# Patient Record
Sex: Female | Born: 1970 | Race: White | Hispanic: Yes | Marital: Single | Smoking: Never smoker
Health system: Southern US, Community
[De-identification: ages and names within clinical notes are randomized; demographics above are authoritative.]

## PROBLEM LIST (undated history)

## (undated) DIAGNOSIS — I10 Essential (primary) hypertension: Secondary | ICD-10-CM

## (undated) DIAGNOSIS — E119 Type 2 diabetes mellitus without complications: Secondary | ICD-10-CM

---

## 2002-04-05 ENCOUNTER — Ambulatory Visit (HOSPITAL_COMMUNITY): Admission: RE | Admit: 2002-04-05 | Discharge: 2002-04-05 | Payer: Self-pay | Admitting: *Deleted

## 2002-04-10 ENCOUNTER — Encounter: Admission: RE | Admit: 2002-04-10 | Discharge: 2002-04-10 | Payer: Self-pay | Admitting: *Deleted

## 2002-04-17 ENCOUNTER — Encounter: Admission: RE | Admit: 2002-04-17 | Discharge: 2002-04-17 | Payer: Self-pay | Admitting: *Deleted

## 2002-04-22 ENCOUNTER — Encounter: Admission: RE | Admit: 2002-04-22 | Discharge: 2002-07-02 | Payer: Self-pay | Admitting: *Deleted

## 2002-04-29 ENCOUNTER — Ambulatory Visit (HOSPITAL_COMMUNITY): Admission: RE | Admit: 2002-04-29 | Discharge: 2002-04-29 | Payer: Self-pay | Admitting: *Deleted

## 2002-05-01 ENCOUNTER — Encounter: Admission: RE | Admit: 2002-05-01 | Discharge: 2002-05-01 | Payer: Self-pay | Admitting: *Deleted

## 2002-05-08 ENCOUNTER — Encounter: Admission: RE | Admit: 2002-05-08 | Discharge: 2002-05-08 | Payer: Self-pay | Admitting: *Deleted

## 2002-05-16 ENCOUNTER — Encounter: Admission: RE | Admit: 2002-05-16 | Discharge: 2002-05-16 | Payer: Self-pay | Admitting: *Deleted

## 2002-05-22 ENCOUNTER — Encounter: Admission: RE | Admit: 2002-05-22 | Discharge: 2002-05-22 | Payer: Self-pay | Admitting: *Deleted

## 2002-05-24 ENCOUNTER — Inpatient Hospital Stay (HOSPITAL_COMMUNITY): Admission: AD | Admit: 2002-05-24 | Discharge: 2002-05-29 | Payer: Self-pay | Admitting: *Deleted

## 2002-05-27 ENCOUNTER — Encounter: Payer: Self-pay | Admitting: *Deleted

## 2003-09-22 ENCOUNTER — Inpatient Hospital Stay (HOSPITAL_COMMUNITY): Admission: AD | Admit: 2003-09-22 | Discharge: 2003-09-24 | Payer: Self-pay | Admitting: Obstetrics and Gynecology

## 2006-02-15 ENCOUNTER — Ambulatory Visit (HOSPITAL_COMMUNITY): Admission: RE | Admit: 2006-02-15 | Discharge: 2006-02-15 | Payer: Self-pay | Admitting: Obstetrics and Gynecology

## 2006-03-13 ENCOUNTER — Ambulatory Visit: Payer: Self-pay | Admitting: *Deleted

## 2006-03-20 ENCOUNTER — Ambulatory Visit: Payer: Self-pay | Admitting: Obstetrics & Gynecology

## 2006-03-20 ENCOUNTER — Encounter: Payer: Self-pay | Admitting: *Deleted

## 2006-03-20 ENCOUNTER — Ambulatory Visit (HOSPITAL_COMMUNITY): Admission: RE | Admit: 2006-03-20 | Discharge: 2006-03-20 | Payer: Self-pay | Admitting: Obstetrics and Gynecology

## 2006-03-27 ENCOUNTER — Ambulatory Visit: Payer: Self-pay | Admitting: Obstetrics & Gynecology

## 2006-04-03 ENCOUNTER — Ambulatory Visit: Payer: Self-pay | Admitting: Gynecology

## 2006-04-17 ENCOUNTER — Ambulatory Visit: Payer: Self-pay | Admitting: Family Medicine

## 2006-04-24 ENCOUNTER — Ambulatory Visit: Payer: Self-pay | Admitting: Obstetrics & Gynecology

## 2006-05-01 ENCOUNTER — Ambulatory Visit: Payer: Self-pay | Admitting: Obstetrics & Gynecology

## 2006-05-08 ENCOUNTER — Ambulatory Visit: Payer: Self-pay | Admitting: Family Medicine

## 2006-05-08 ENCOUNTER — Ambulatory Visit (HOSPITAL_COMMUNITY): Admission: RE | Admit: 2006-05-08 | Discharge: 2006-05-08 | Payer: Self-pay | Admitting: Family Medicine

## 2006-05-09 ENCOUNTER — Inpatient Hospital Stay (HOSPITAL_COMMUNITY): Admission: AD | Admit: 2006-05-09 | Discharge: 2006-05-12 | Payer: Self-pay | Admitting: Family Medicine

## 2006-05-09 ENCOUNTER — Ambulatory Visit: Payer: Self-pay | Admitting: Obstetrics and Gynecology

## 2006-12-16 IMAGING — US US OB COMP +14 WK
1 of 2 series · 13 of 28 positions shown · non-contrast
Comparison: none

CLINICAL DATA: Assess growth.

[Series 1: us ob comp +14 wk · 0.35mm/px · 13 of 46 slices shown]
[im 1/46]
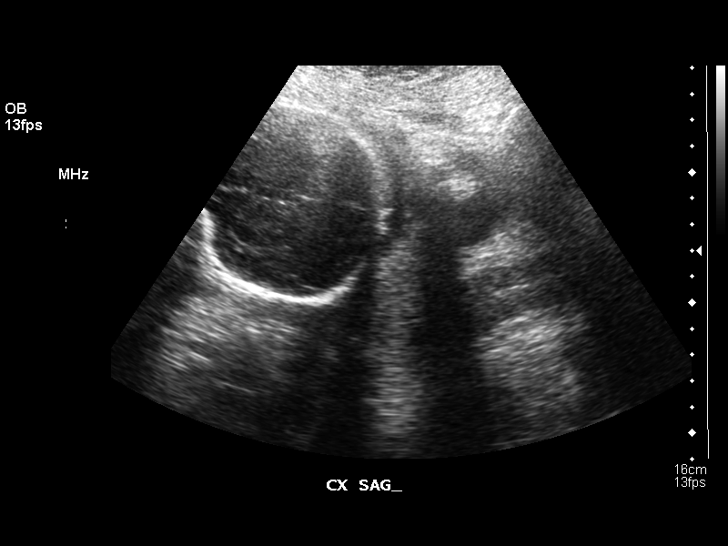
[im 4/46]
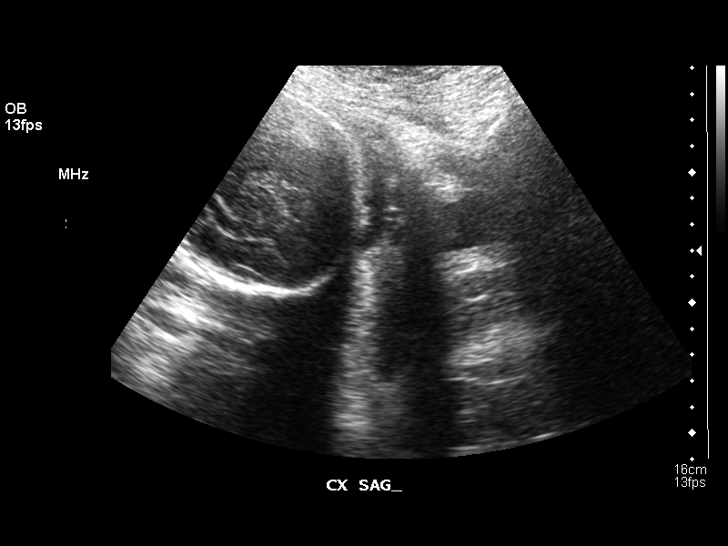
[im 7/46]
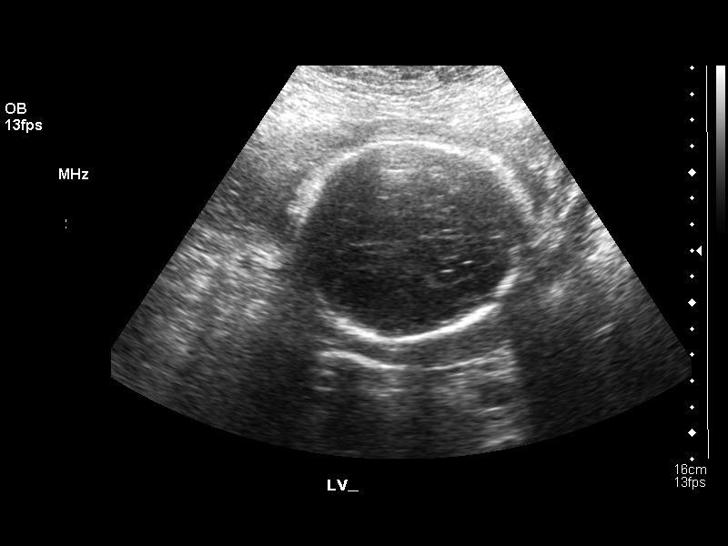
[im 11/46]
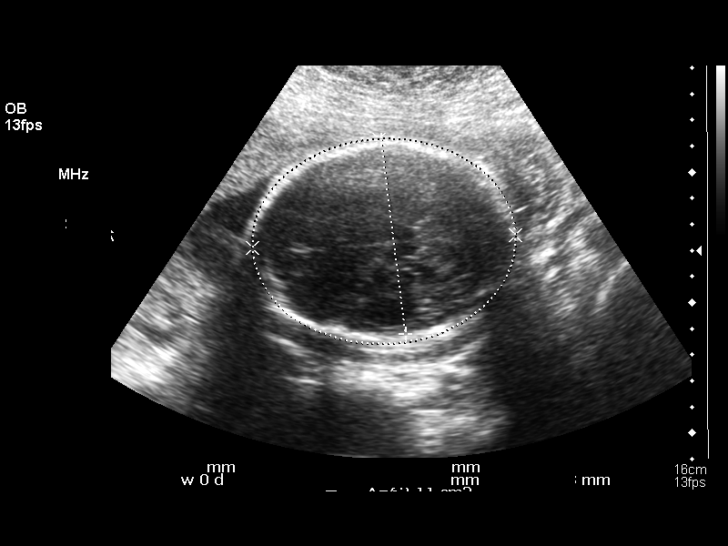
[im 14/46]
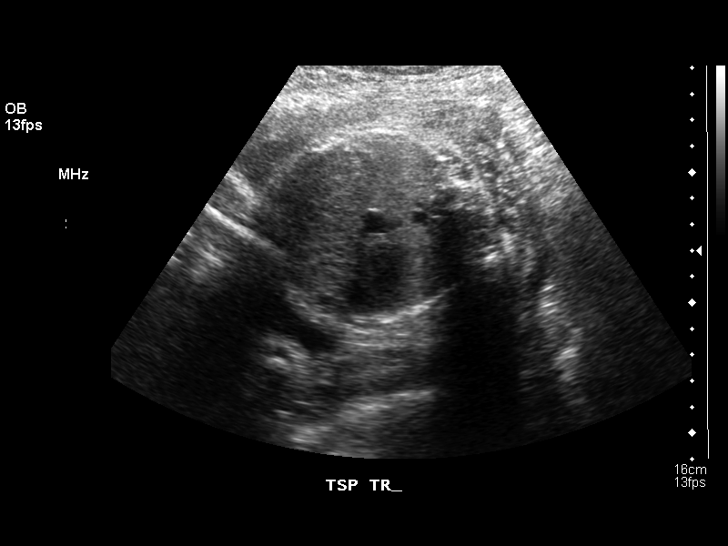
[im 18/46]
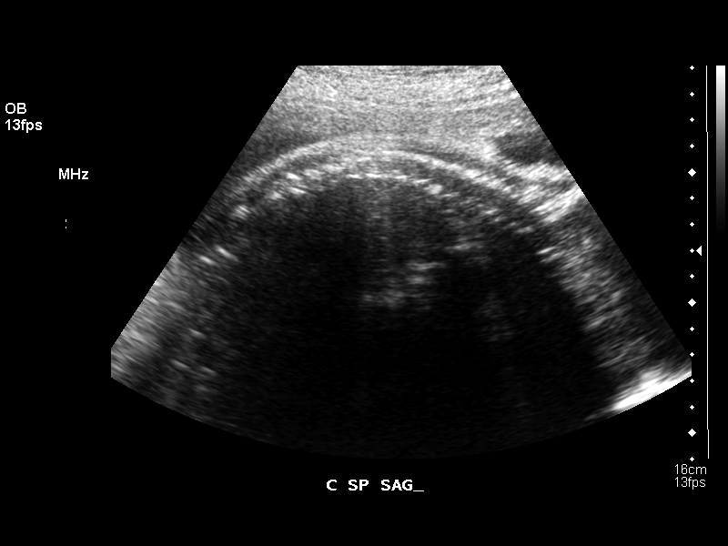
[im 23/46]
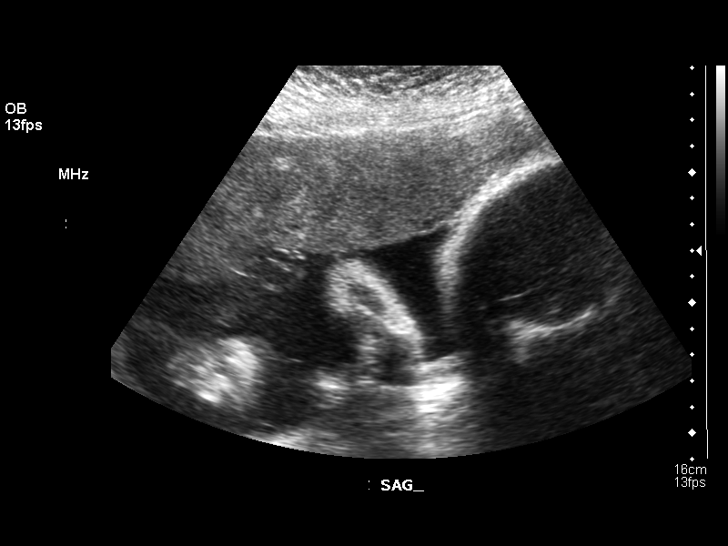
[im 26/46]
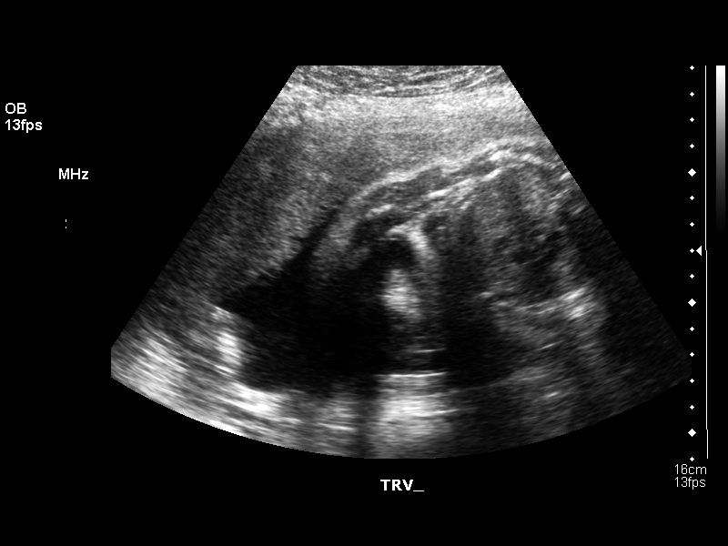
[im 30/46]
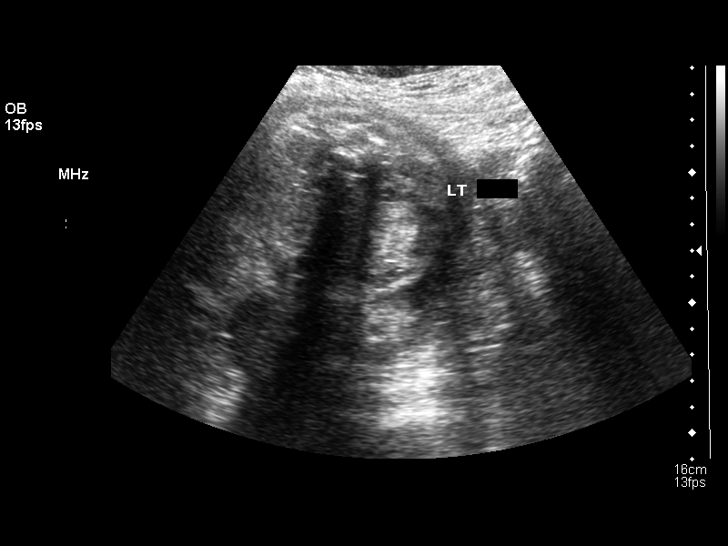
[im 33/46]
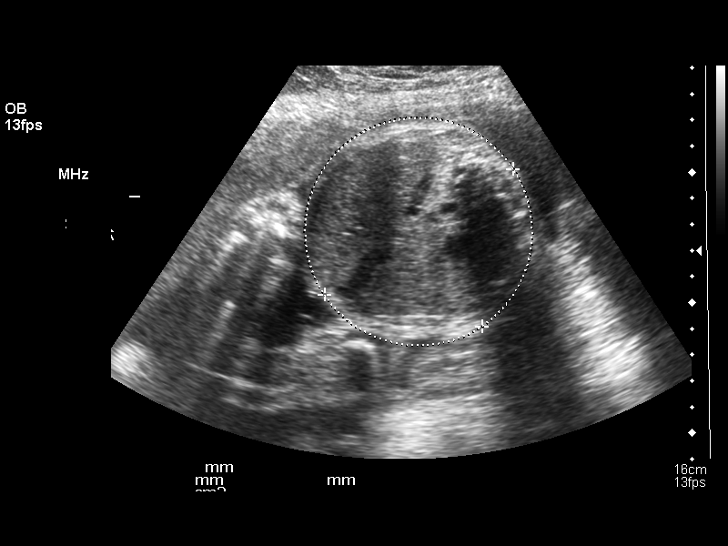
[im 37/46]
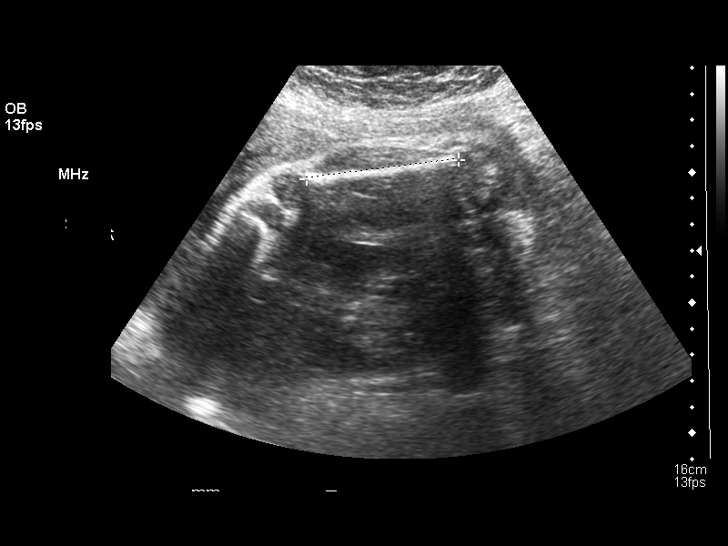
[im 40/46]
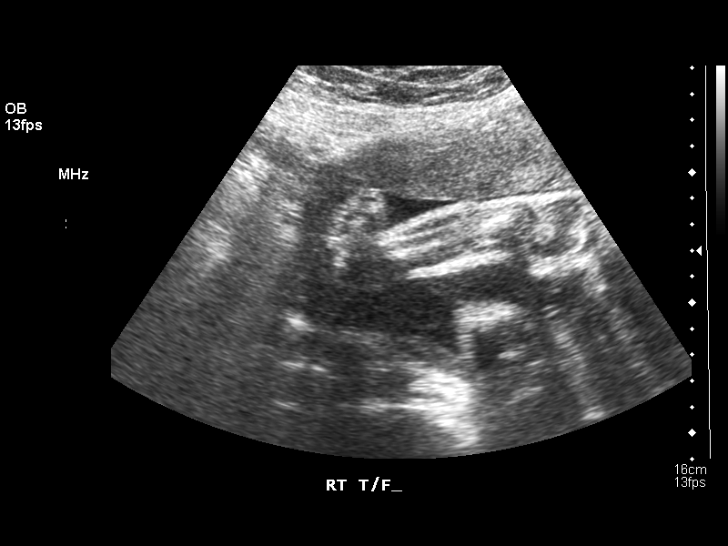
[im 44/46]
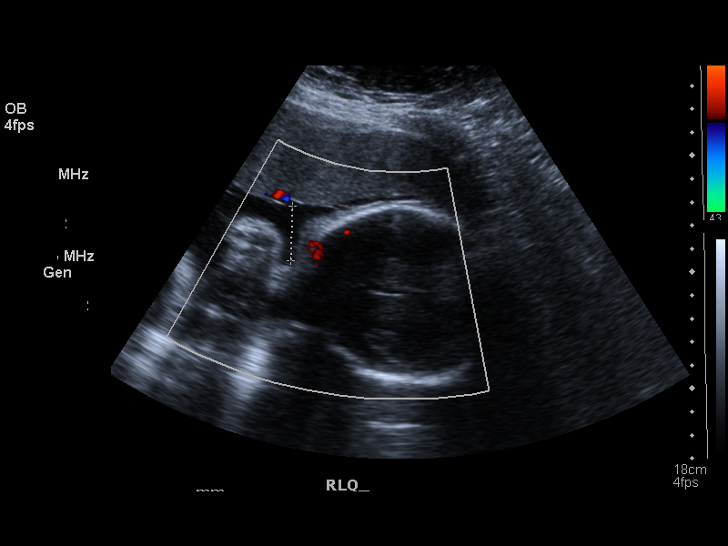

[13 of 28 positions shown; findings below may reference images not displayed]

OBSTETRICAL ULTRASOUND:
 Number of Fetuses: 1
 Heart Rate: 144
 Movement:  Yes
 Breathing:  No  
 Presentation:  Cephalic
 Placental Location: Anterior
 Grade:  I
 Previa:  No 
 Amniotic Fluid (Subjective): Normal
 Amniotic Fluid (Objective):   15.5 cm AFI (5th -95th%ile = 9.0 ? 23.4 cm for 30 wks)

 FETAL BIOMETRY
 BPD:   7.6 cm   30 w 4 d
 HC:   28.6 cm   31 w 3 d
 AC:   27.0 cm  31 w 1 d
 FL:   5.8 cm  30 w 4 d

 MEAN GA:  31 w 0 d  US EDC:  05/22/06
 Fetal indices are within normal limits. 
 EFW:  4666 g (H) 75th ? 90th%ile (5755 ? 1735 g) For 30 wks

 FETAL ANATOMY
 Lateral Ventricles:    Visualized 
 Thalami/CSP:      Visualized 
 Posterior Fossa:  Previously seen 
 Nuchal Region:    Previously seen 
 Spine:      Previously seen 
 4 Chamber Heart on Left:      Previously seen 
 Stomach on Left:      Visualized 
 3 Vessel Cord:    Visualized 
 Cord Insertion site:    Previously seen 
 Kidneys:  Visualized 
 Bladder:  Visualized 
 Extremities:      Previously seen 

 ADDITIONAL ANATOMY VISUALIZED:  Diaphragm.

 Evaluation limited by: Maternal habitus and advanced gestational age.

 MATERNAL UTERINE AND ADNEXAL FINDINGS
 Cervix: 3.5 cm transabdominally
IMPRESSION: 1.  Single intrauterine pregnancy demonstrating an estimated gestational age by ultrasound of 31 weeks and 0 days.  Correlation with expected estimated gestational age by LMP of 30 weeks and 3 days correlates with appropriate growth.  Currently the estimated fetal weight is between the 75th and 90th percentile for a 30 week gestation.  
 2.   Subjectively and quantitatively normal amniotic fluid volume and normal cervical length.
 3.  No late developing fetal anatomic abnormalities are identified associated with the lateral ventricles, stomach, kidneys, or bladder.  
 4.  The patient was sent with a preliminary copy of today?s results to a clinic visit immediately following this exam.

## 2007-02-03 IMAGING — US US OB FOLLOW-UP
1 series · 18 of 28 positions shown · non-contrast
Comparison: none

CLINICAL DATA: 37 week 3 day assigned gestational age.  Decreased fetal movement.  Maternal obesity.  Evaluate fetal growth and amniotic fluid.

[Series 1: us ob re-eval · 18 of 31 slices shown]
[im 1/31]
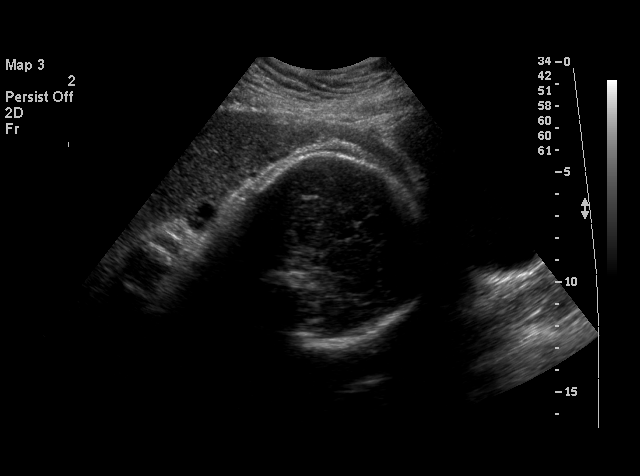
[im 3/31]
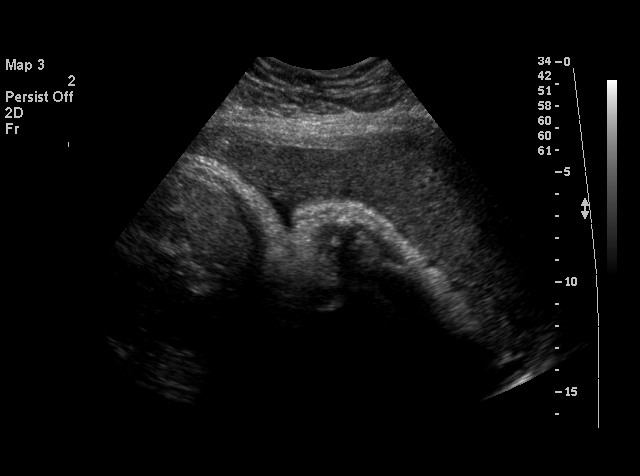
[im 4/31]
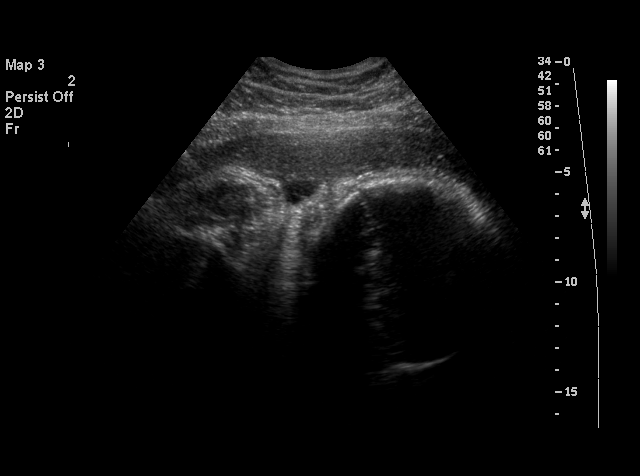
[im 6/31]
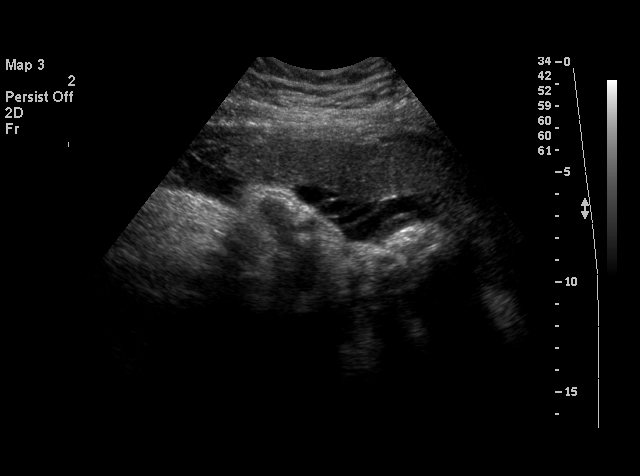
[im 8/31]
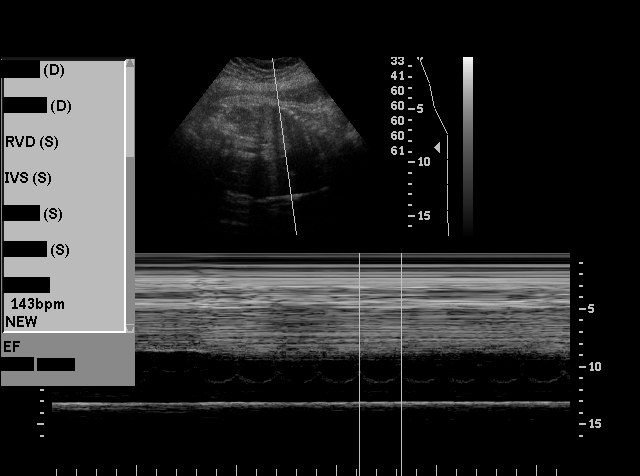
[im 9/31]
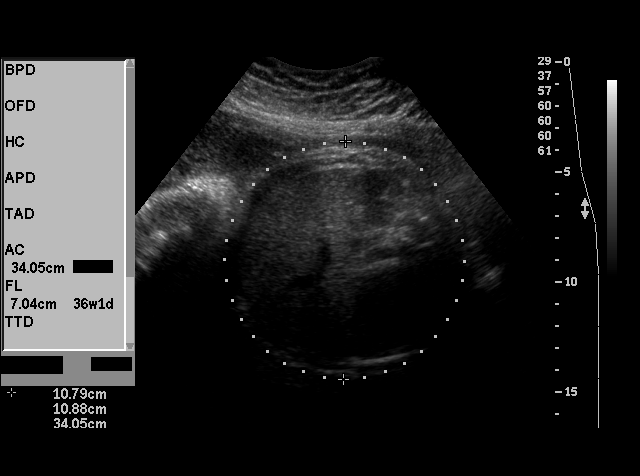
[im 12/31]
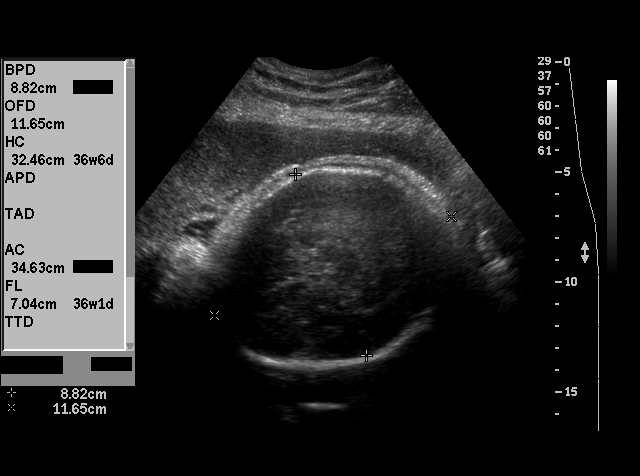
[im 13/31]
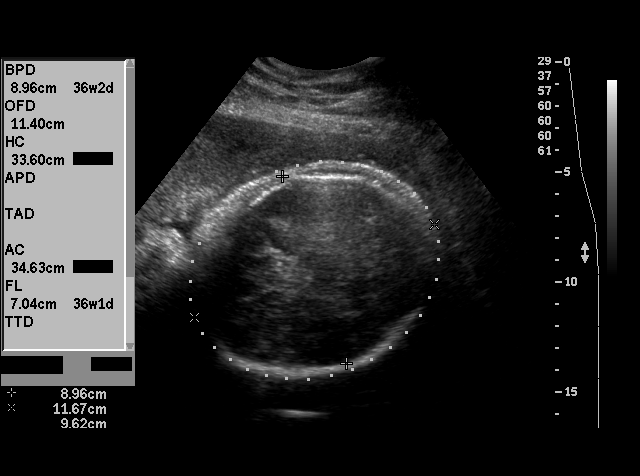
[im 15/31]
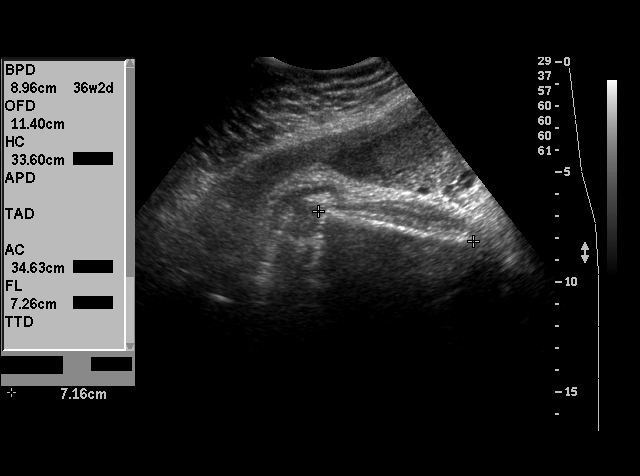
[im 16/31]
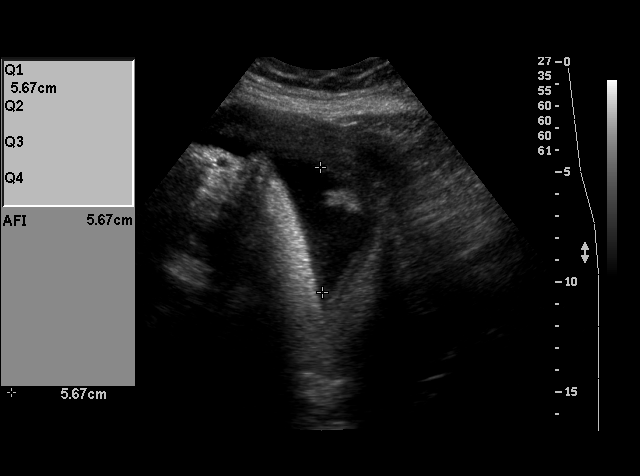
[im 18/31]
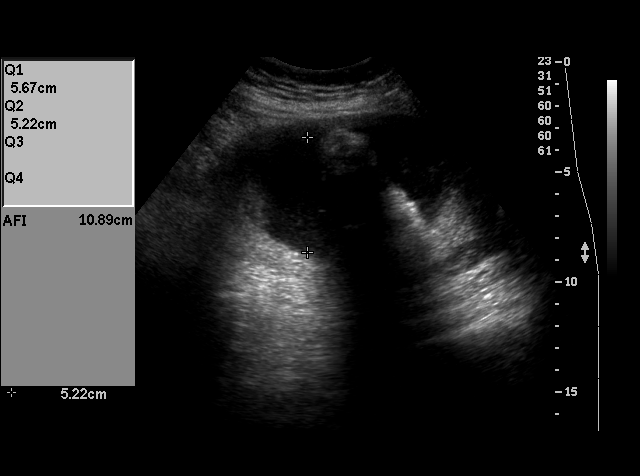
[im 19/31]
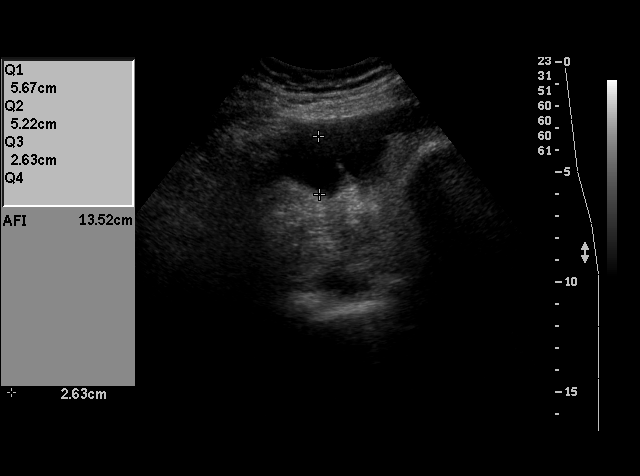
[im 22/31]
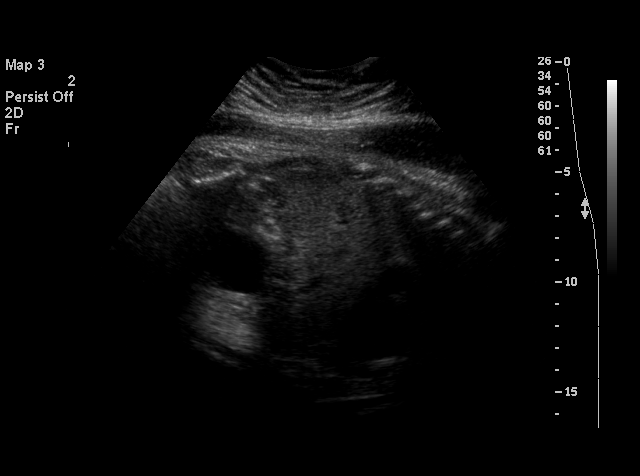
[im 24/31]
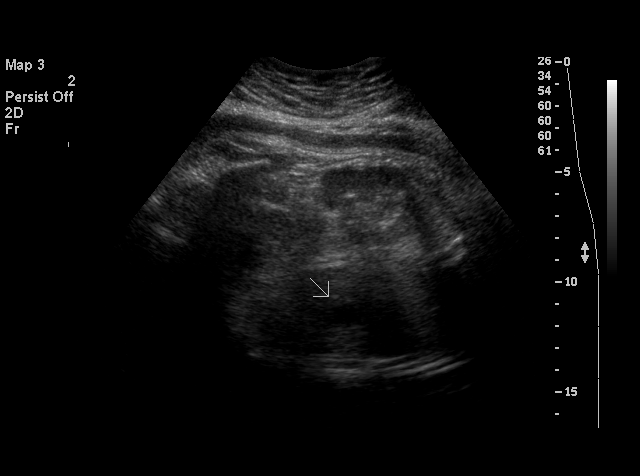
[im 25/31]
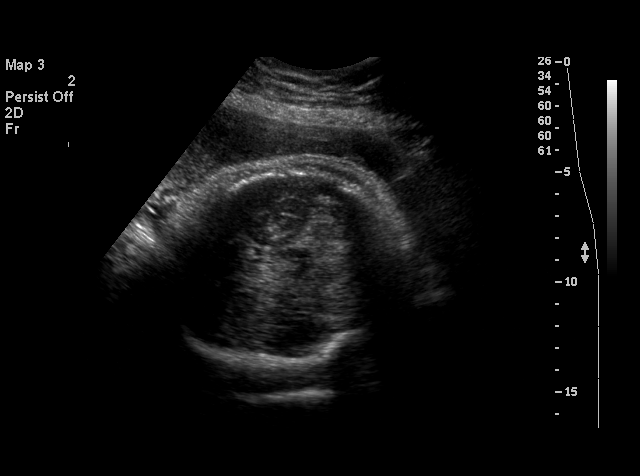
[im 27/31]
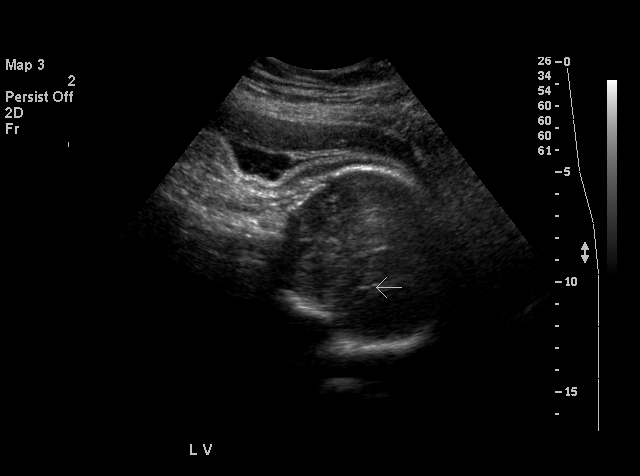
[im 28/31]
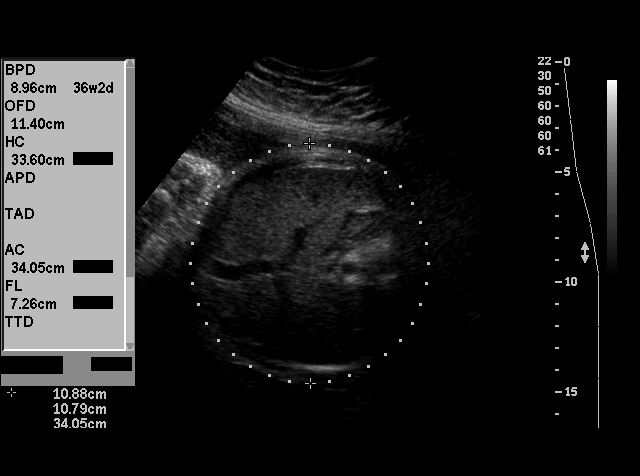
[im 31/31]
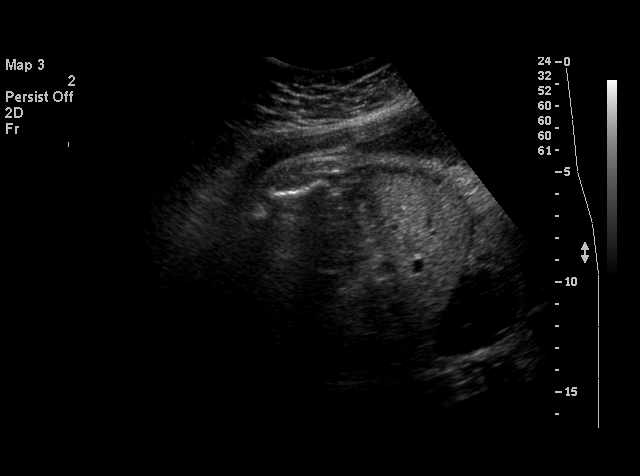

[18 of 28 positions shown; findings below may reference images not displayed]

OBSTETRICAL ULTRASOUND RE-EVALUATION:
 Number of Fetuses:  1
 Heart Rate:  143 bpm
 Movement:  Yes
 Breathing:  Yes
 Presentation:  Cephalic
 Placental Location:   Anterior
 Grade:  II
 Previa:   No
 Amniotic Fluid (subjective):  Normal
 Amniotic Fluid (objective):  AFI 13.5 cm (7th-37th %ile =  7.5 to 24.4 cm for 37 weeks) 

 FETAL BIOMETRY
 BPD:    8.9 cm    36 w 3 d
 HC:  33.1 cm  37 w 5 d
 AC:  34.1 cm  38 w 1 d
 FL:  7.2 cm  37 w 2 d

 Mean GA:  37 w 3 d    US EDC:  05/26/06
 Assigned GA:  37 w 3 d    Assigned EDC:  05/26/06

 EFW:  3086 grams  75th - 90th %ile (4722 - 9649 g) for 37 weeks 

 FETAL ANATOMY
 Lateral Ventricles:    Visualized     
 Thalami/CSP:  Visualized 
 Posterior Fossa:  Not visualized 
 Nuchal Region:  Not visualized 
 Spine:  Not visualized 
 4 Chamber Heart on Left:  Not visualized 
 Stomach on Left:  Visualized   
 3 Vessel Cord:    Not visualized 
 Cord Insertion Site:  Not visualized 
 Kidneys:  Visualized 
 Bladder:  Visualized 
 Extremities:  Not visualized 

 Evaluation limited by:  Maternal habitus and advanced gestational age.

   MATERNAL UTERINE AND ADNEXAL FINDINGS
 Cervix:  Not evaluated; >34 weeks.
IMPRESSION: 1.  Assigned gestational age is currently 37 weeks 3 days.  Appropriate fetal growth, with EFW at 75th to 90th percentile.
 2.  Normal amniotic fluid volume.

## 2007-02-04 IMAGING — US US FETAL BPP W/O NONSTRESS
1 series · 14 of 21 positions shown · non-contrast
Comparison: None.

CLINICAL DATA: Late third trimester pregnancy, possible rupture of membranes.  Gestational diabetes.  

 BIOPHYSICAL PROFILE ? 05/09/06:

[Series 1: us fetal bpp w/o nonstress · 0.41mm/px · 21 acquisitions, 14 frames shown]
[im 1/21]
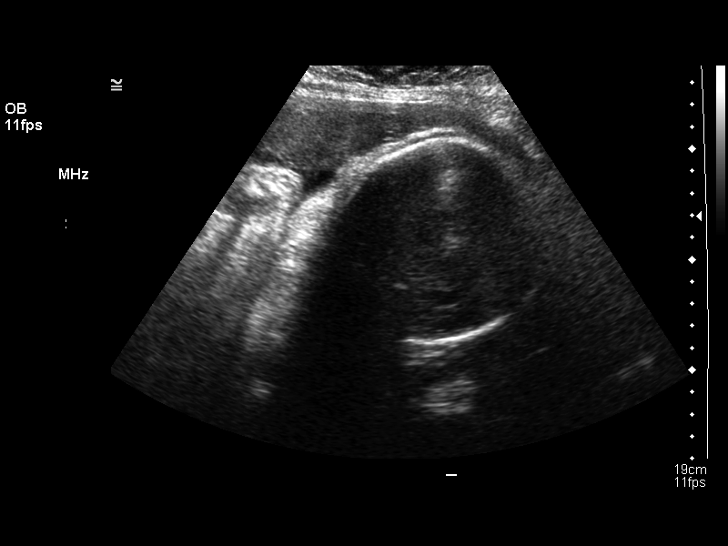
[im 3/21]
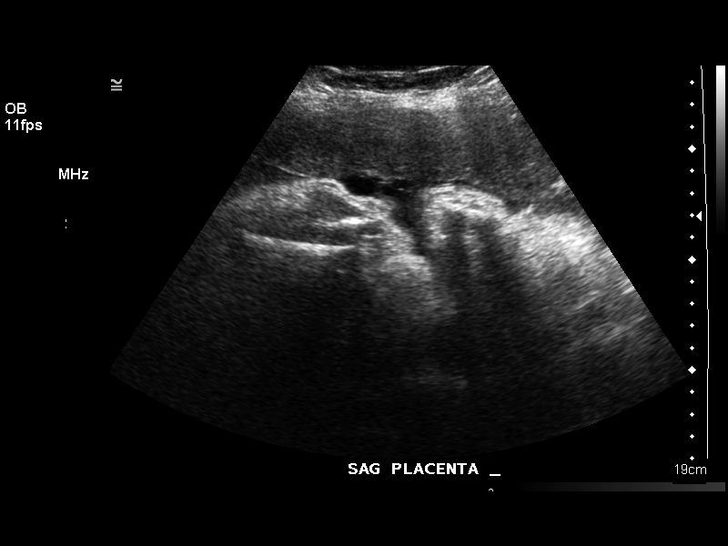
[im 4/21]
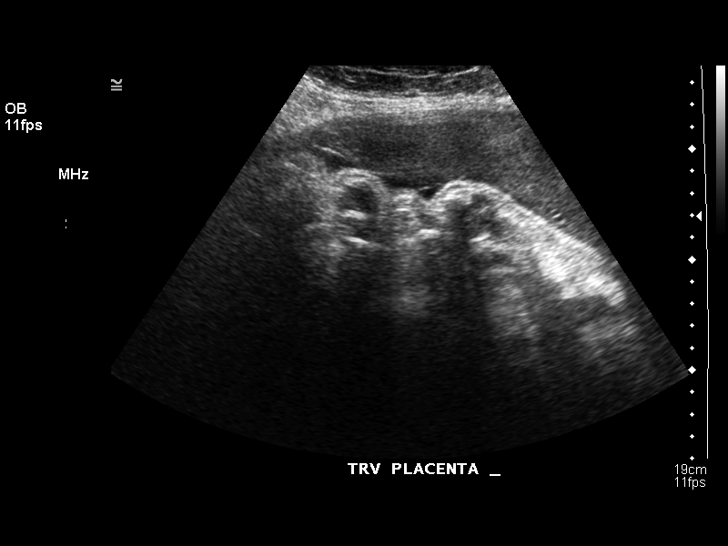
[im 6/21]
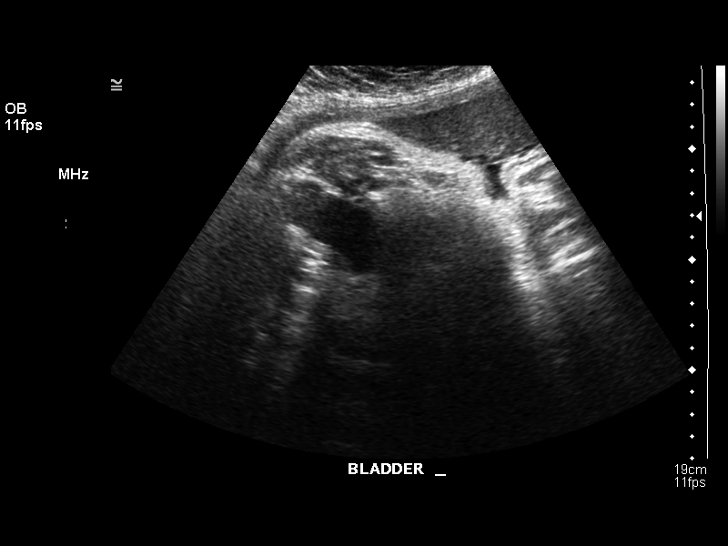
[im 7/21]
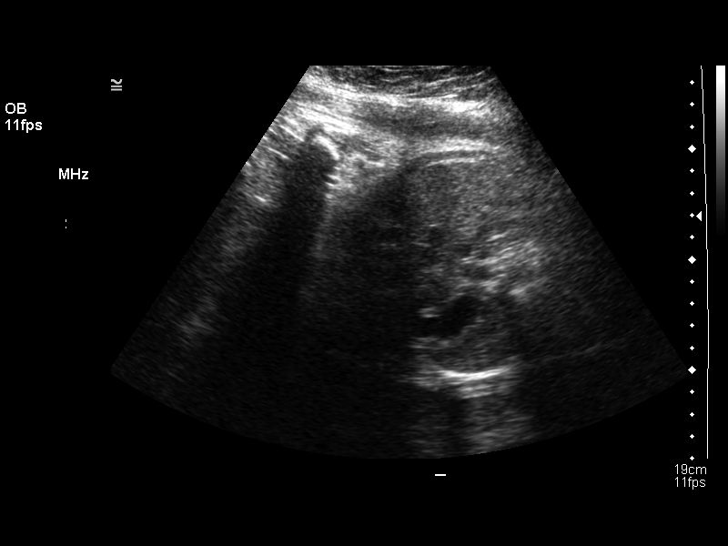
[im 9/21]
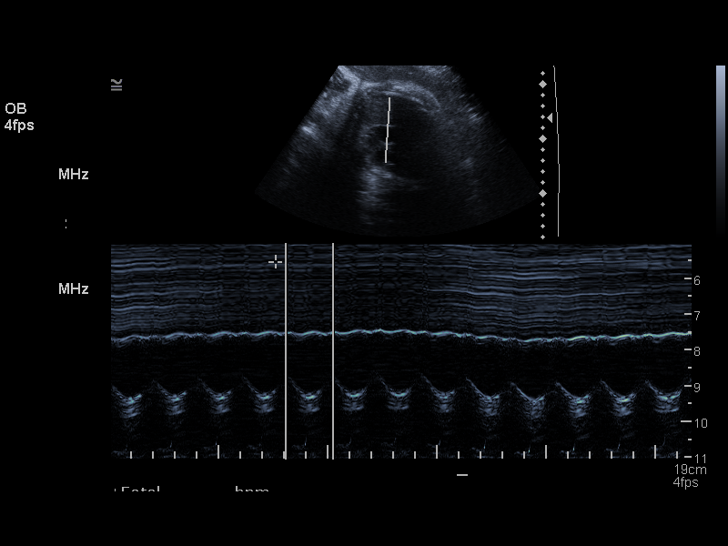
[im 10/21]
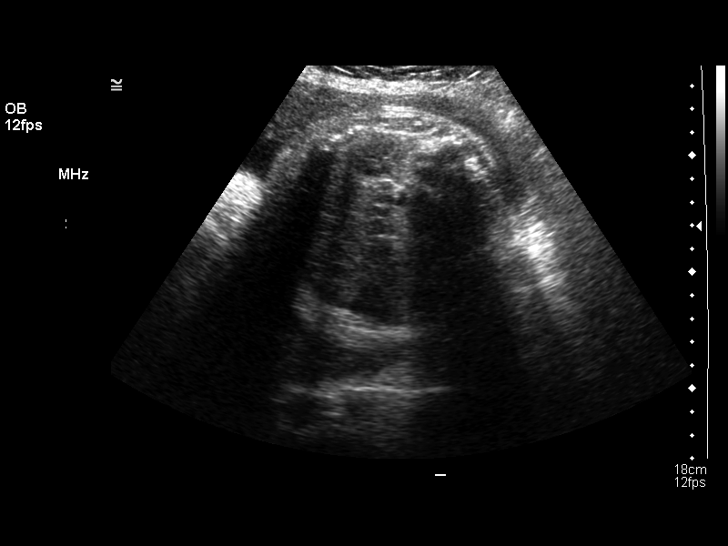
[im 12/21]
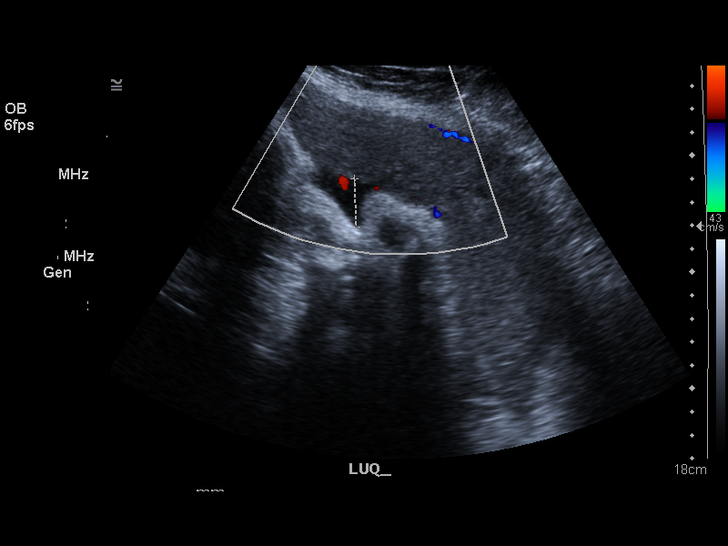
[im 13/21]
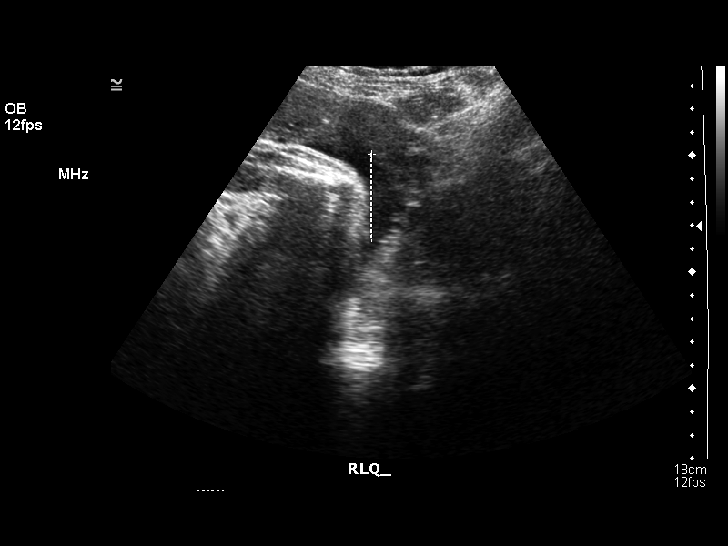
[im 15/21]
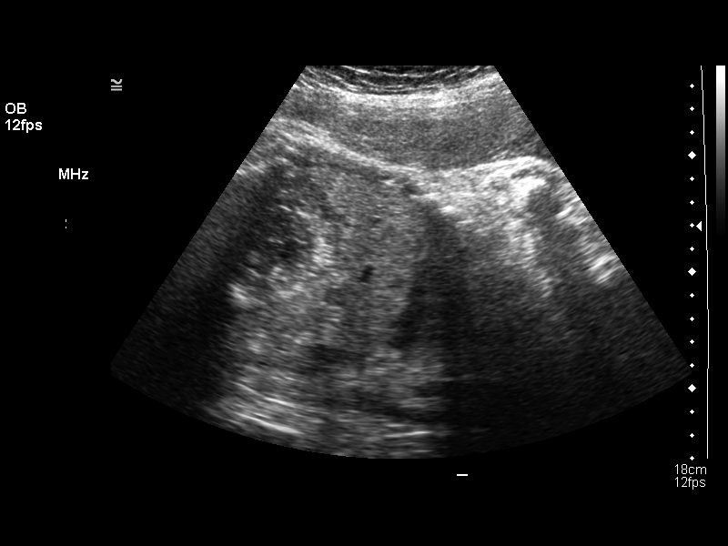
[im 16/21]
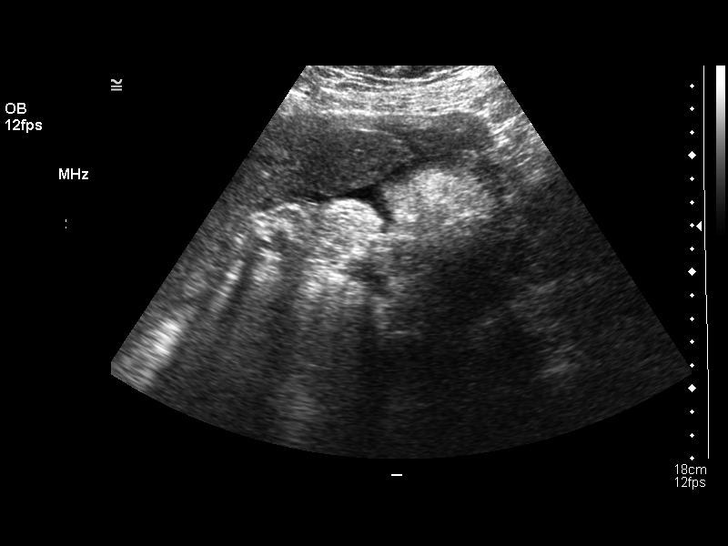
[im 18/21]
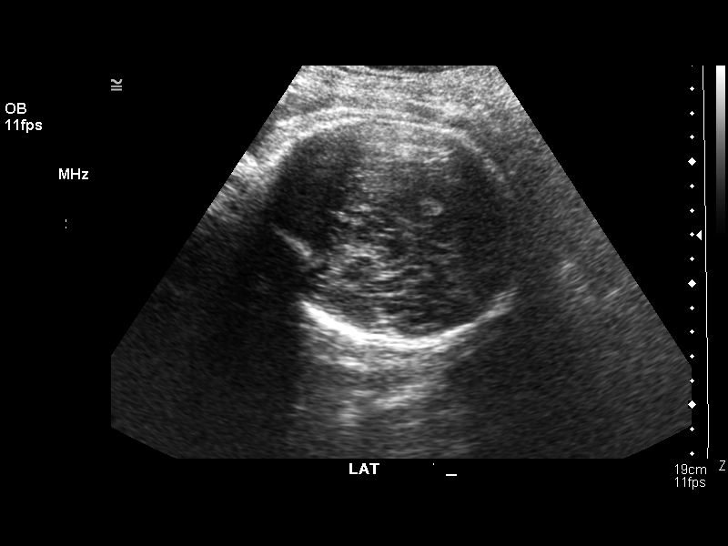
[im 19/21]
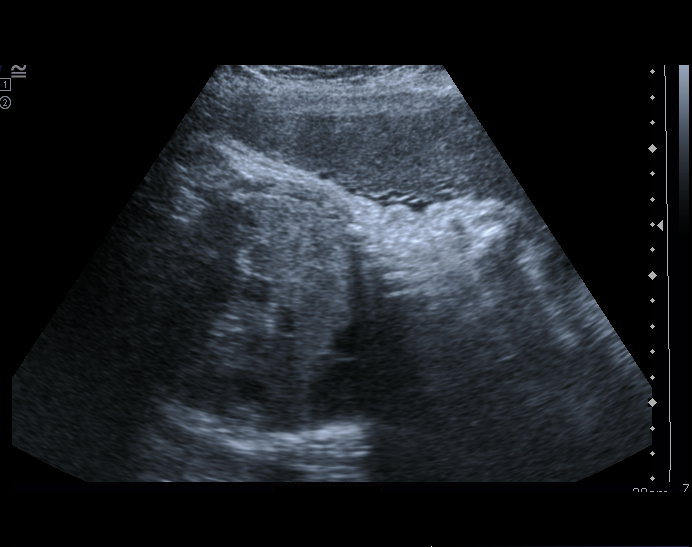
[im 21/21]
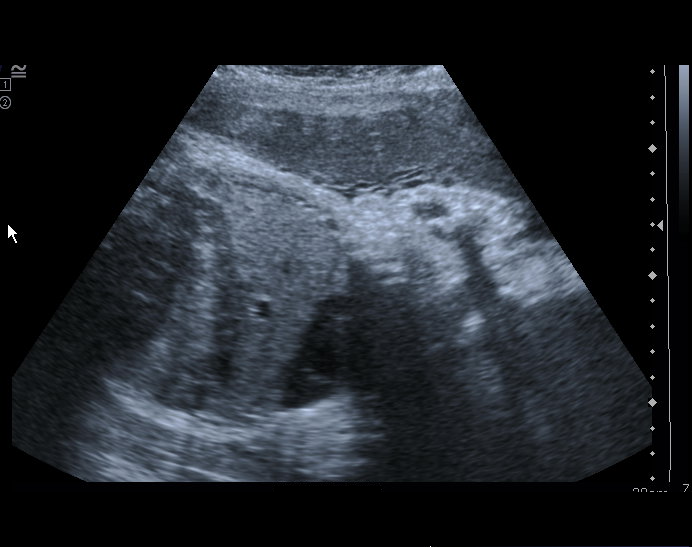

[14 of 21 positions shown; findings below may reference images not displayed]

FINDINGS: Number of Fetuses:  1
 Heart rate:  139 bpm
 Movement:  Yes (minimal movement)
 Breathing:    Yes
 Presentation:  Cephalic
 Placental Location:  Anterior
 Grade:  I
 Previa:  No
 Amniotic Fluid (Subjective):  Decreased
 Amniotic Fluid (Objective):  AFI 8.0 cm (1th-31th %ile = 7.3 to 23.9 cm for 38 weeks) 

 Fetal measurements and complete anatomic evaluation were not requested.  The following fetal anatomy was visualized on this exam:  Lateral ventricles, thalami, stomach, kidneys, bladder, and diaphragm.

 BPP SCORING
 Movements:     2  Time:  25 minutes
 Breathing:    2
 Tone:  2
 Amniotic Fluid:  2
 Total Score:  8

 MATERNAL UTERINE AND ADNEXAL FINDINGS
 Cervix:  Not evaluated; >38 weeks.
IMPRESSION: Single living intrauterine pregnancy with fetal cardiac activity at 139 beats per minute and biophysical profile score of [DATE].

## 2007-02-04 IMAGING — US US OB FOLLOW-UP
1 series · 9 of 9 positions shown · non-contrast
Comparison: none

CLINICAL DATA: 37 week 4 day assigned gestational age.  Gestational diabetes.  Probable ruptured membranes.  Evaluate fetal growth.

[Series 1: us ob follow-up · 0.37mm/px · 9 of 9 slices shown]
[im 1/9]
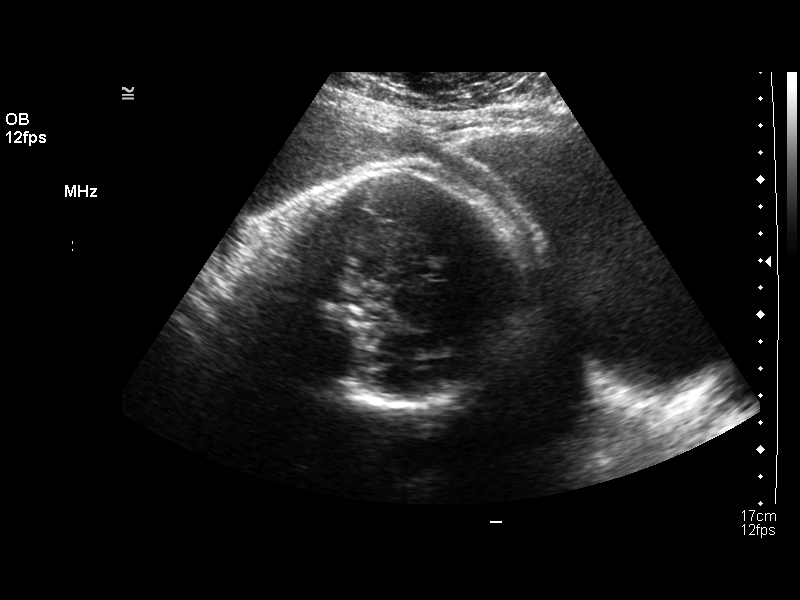
[im 2/9]
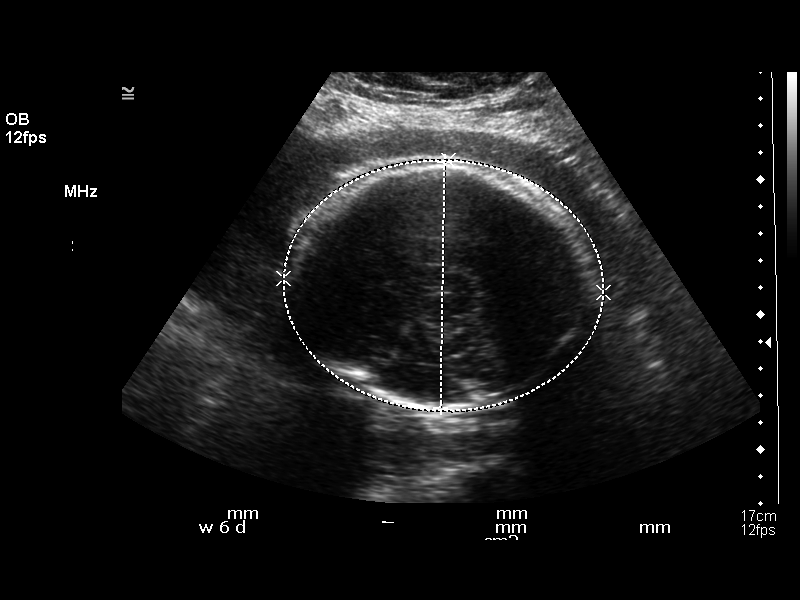
[im 3/9]
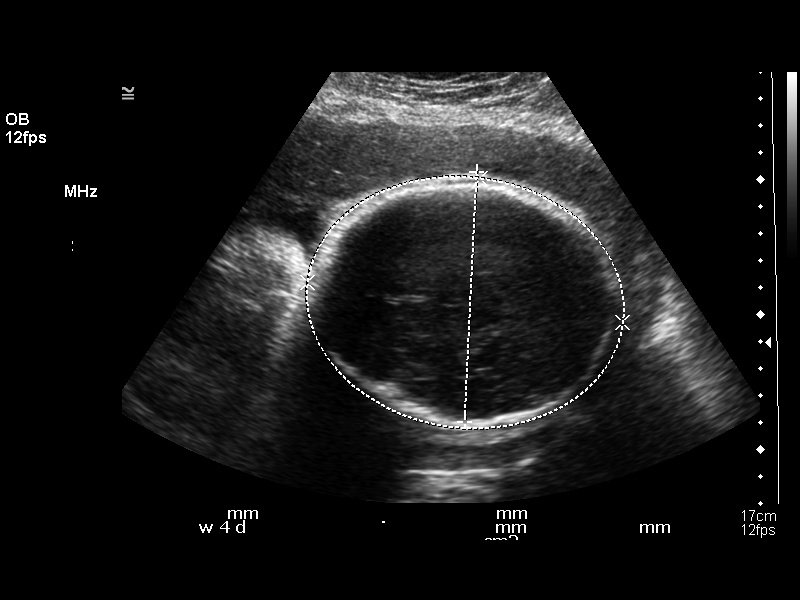
[im 4/9]
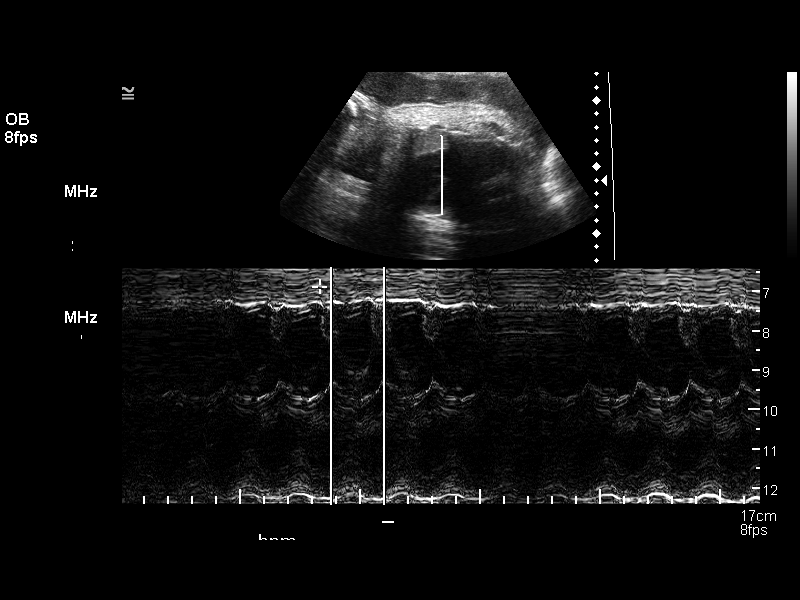
[im 5/9]
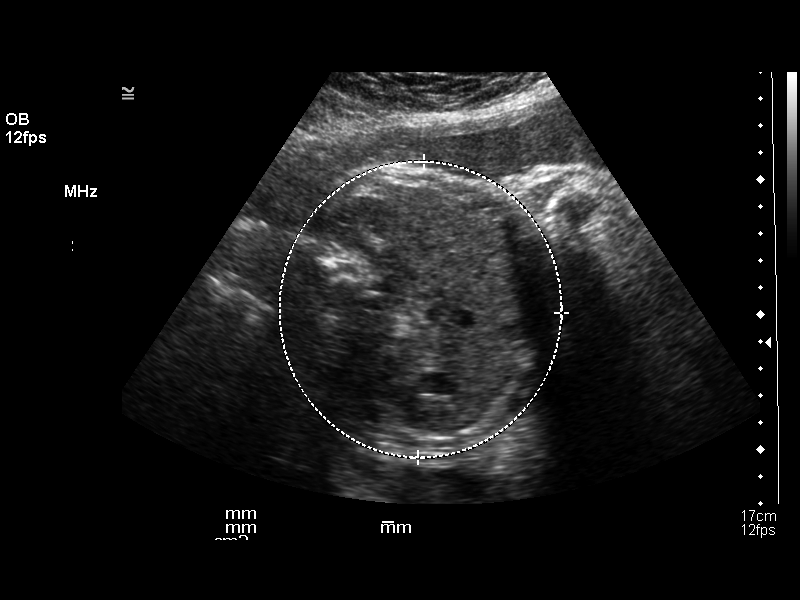
[im 6/9]
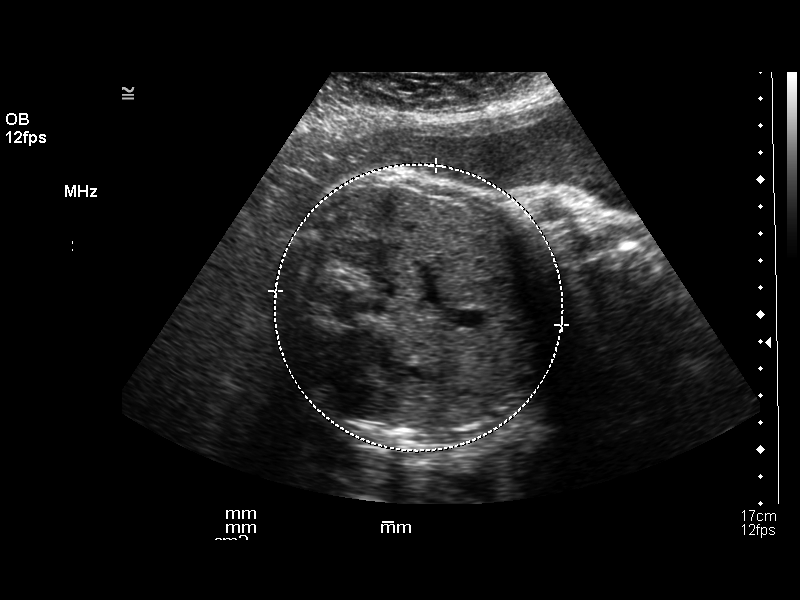
[im 7/9]
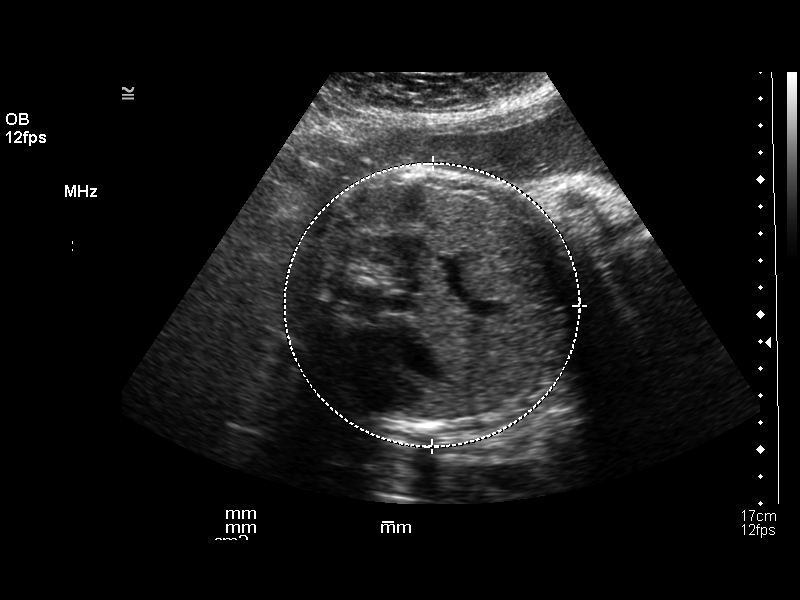
[im 8/9]
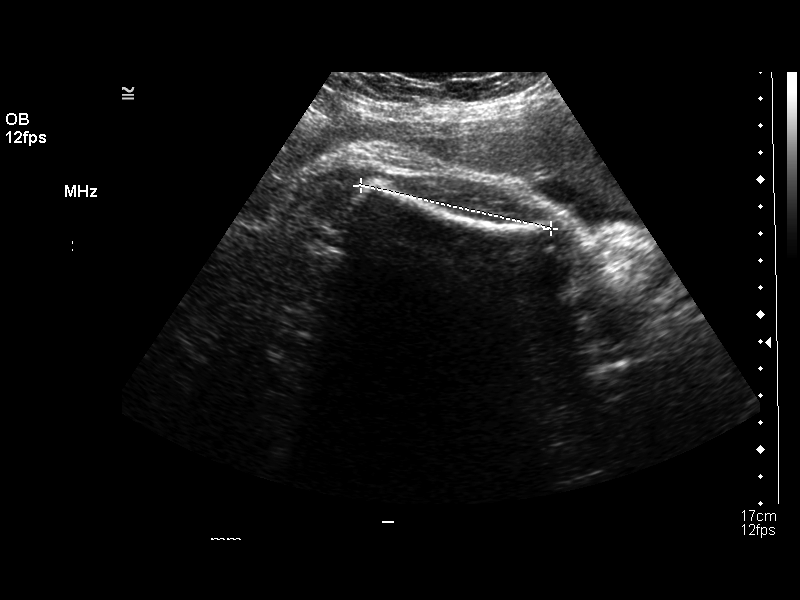
[im 9/9]
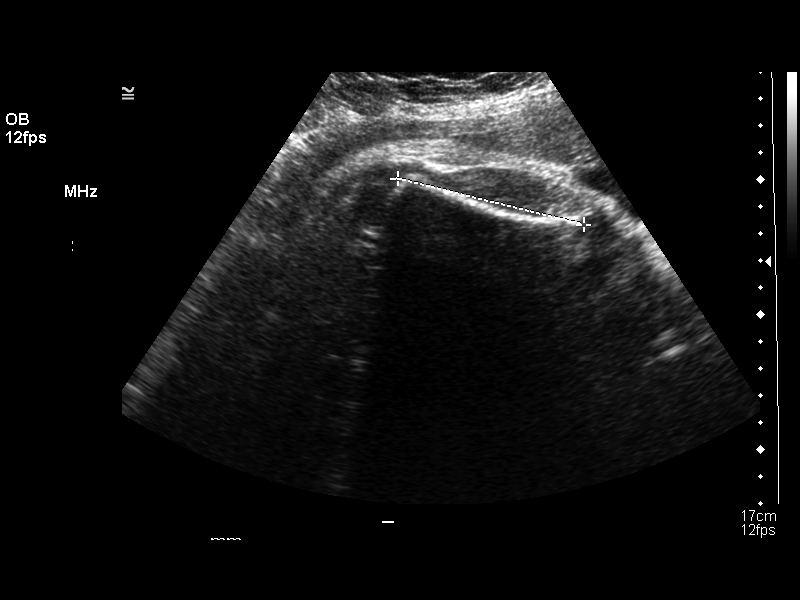

[9 of 9 positions shown; findings below may reference images not displayed]

OBSTETRICAL ULTRASOUND RE-EVALUATION:
Number of Fetuses:  1
Heart Rate: 136 bpm
Movement:  Yes
Breathing:  No
Presentation:  Cephalic
Placental Location:  Anterior
Grade:  I
Previa:  No
Amniotic Fluid (subjective):    Decreased
Amniotic Fluid (objective):  AFI 8.0 cm (7th-17th %ile = 7.3 to 23.9 cm for 38 weeks) 

FETAL BIOMETRY
BPD:    9.2    37 w 2 d
HC:  33.1 cm  37 w 5 d
AC:  33.5 cm  37 w 3 d
FL:  7.1 cm  36 w 3 d

Mean GA:  37 w 2 d    US EDC:   05/28/06
Assigned GA:  37 w 4 d      Assigned EDC:   05/26/06

EFW:  0263   grams  50th - 75th %ile (5295 - 9536 g) for 38 weeks 

FETAL ANATOMY
Lateral Ventricles:    Previously visualized 
Thalami/CSP:  Visualized 
Posterior Fossa:  Previously visualized 
Nuchal Region:  Previously visualized 
Spine:  Previously visualized 
4 Chamber Heart on Left:  Previously visualized 
Stomach on Left:  Visualized 
3 Vessel Cord:  Previously visualized 
Cord Insertion Site:  Previously visualized   
Kidneys:  Previously visualized 
Bladder:  Previously visualized 
Extremities:  Previously visualized 

Evaluation limited by:  Maternal habitus and advanced gestational age.

  MATERNAL UTERINE AND ADNEXAL FINDINGS
Cervix:  Not evaluated; >34 weeks.
IMPRESSION: 1.  Assigned gestational age is currently 37 weeks 4 days.  Appropriate fetal growth, with EFW at 50-75th percentile.
2.  Subjectively decreased amniotic fluid volume with AFI of 8.0 cm.

## 2010-02-24 ENCOUNTER — Ambulatory Visit: Payer: Self-pay | Admitting: Internal Medicine

## 2010-05-05 ENCOUNTER — Ambulatory Visit: Payer: Self-pay | Admitting: Internal Medicine

## 2011-02-04 NOTE — Discharge Summary (Signed)
Robyn [name], Robyn [name]                     ACCOUNT NO.:  [id number]   MEDICAL RECORD NO.:  [id number]                   PATIENT TYPE:   LOCATION:                                       FACILITY:   PHYSICIAN:  Georgina Peer, M.D.           DATE OF BIRTH:  08/14/1972   DATE OF ADMISSION:  05/24/2002  DATE OF DISCHARGE:  05/29/2002                                 DISCHARGE SUMMARY   ADMISSION DIAGNOSIS:  Spontaneous rupture of membranes at term.   DISCHARGE DIAGNOSES:  1. Vaginal delivery of a viable female infant.  2. Right-sided pyelonephritis.   HISTORY AND PHYSICAL:  The patient is a 40 year old G-6, P-3-2-0-4 who  presented at 38-5/7 weeks to the maternity admissions unit following  spontaneous rupture of membranes and the onset of contractions.  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  During this pregnancy the patient did have  some asymptomatic bacteruria with recurring E. coli and having treated with  Macrobid.  On presentation her only medications were prenatal vitamins.  Her  prenatal labs were all within normal limits except for her 1 hour Glucola  which was 154.  On examination she was afebrile with a blood pressure of  143/74.  Her physical examination was essentially benign.  Her cervical  examination found her to be 6 cm dilated, 100% effaced, at 0 station, and  the baby in vertex presentation.  Fetal heart tracing was reassuring.  Her  contractions were occurring every 2-3 minutes.  The patient admitted to  labor and delivery for expected management.   HOSPITAL COURSE:  The patient was started on low dose Pitocin for  augmentation of contractions and was given Stadol for pain initially.  However, later on an epidural was placed.  On the day of admission a viable  female infant was delivered by  spontaneous vaginal delivery.  Apgars were 8  at one minute and 8 at five minutes.  The baby weighed 8 pounds 8 ounces.  The placenta was delivered spontaneously and no lacerations were noted.  On  post partum day #1 the patient developed some onset of fever and chills with  a temperature as high as 104.3.  She was also complaining of low back pain  and was found to have right CVA tenderness on examination.  A catheterized  UA was obtained as was a urine culture and sensitivity.  The patient was  started on ampicillin, gentamycin, and clindamycin.  The urine culture grew  pan sensitive E. coli and the patient was continued on her triple  antibiotics.  Although the patient continued to spike fevers, she was  recovering well otherwise post partum with decreased lochia and no  difficulties bottle feeding her infant.  On post partum day #3 the paging  service was called  to the patient's room by the nurse secondary to a fever  of 103.5, chills, followed by shortness of breath.  The patient reported  that she had had some left-sided chest pain initially when the fever started  as if someone had been stabbing her.  She was also complaining of right-  sided back pain.  Her vitals showed a blood pressure of 159/81, pulse of  125, respiration rate of 32, temperature of 103.5, and oxygen saturations of  94% on room air, increasing to 97-98% on 4 L of oxygen.  On examination she  was sweaty and anxious but in no acute respiratory distress.  Her lung  examination showed good air flow.  Diffuse end expiratory wheezes but no  crackles.  A firm fundus was felt about 1 fingerbreadth above the umbilicus  and the patient continued to have right CVA tenderness.  Dr. Orlene Erm was  consulted and a chest x-ray PA and lateral was performed which showed  bilateral peribronchial thickening, right hilar fullness, and bilateral  lower lobe interstitial prominence, all of which could be secondary to mild  edema.  Other  superimposed infection was not excluded.  Blood cultures were  ordered and the patient was continued on her oxygen to keep her saturations  greater than 93%.  Upon reexamination an hour later the patient appeared  much better with no sweating and decreased anxiousness.  The patient denies  shortness of breath or chest discomfort at this time and was found to have a  pulse of 108, blood pressure of 113/73, and saturating at 97% on 2 L of  oxygen.  Her lung examination was much improved now clear to auscultation  bilaterally with good air flow and no wheezes or crackles.  An incentive  spirometry was brought to bedside to help the patient with her deep  breathing.  A CT of the abdomen and pelvis with p.o. and IV contrast was  performed to rule out an abscess.  The CT showed right pyelonephritis and no  abscess.  The patient was continued on her triple antibiotics and was weaned  off her oxygen and saturating well on room air.  On post partum day #4 the  patient was without complaint and denied any back pain or shortness of  breath.  She was saturating 99% on room air and her examination was benign  including the absence of CVA tenderness.  The patient was continued on her  IV triple antibiotics for at least 36 hours after her last fever.  On post  partum day #5 she continued to be without complaint, recovering well post  partum, and had been afebrile for over 36 hours.  Physical examination was  again benign and the blood cultures obtained previously showed no growth to  date.  The patient was deemed ready for discharge home on p.o. antibiotics  (Septra double strength) to complete a 14 day course.  The patient had  decided on IUD placement for birth control and was instructed to address  this with her health care Dontavius Keim at her 6 week follow up appointment.  Mother and baby were discharged home on post partum day #5, both doing well.   DISPOSITION:  Discharged to home with  infant.  CONDITION ON DISCHARGE:  Improved and stable.   DISCHARGE MEDICATIONS:  1. Motrin 600 mg p.o. q.6h. p.r.n. cramps or pain.  2. Septra-DS 1 tablet p.o. b.i.d. x14 days.  3. Prenatal vitamins 1 tablet p.o. q.d.   DISCHARGE DIET:  Regular.  DISCHARGE ACTIVITIES:  Per booklet.  Nothing per vagina for 6 weeks.   FOLLOW UP:  Return to high risk clinic in 6 weeks at which time the patient  can receive her IUD for contraception.                                               Georgina Peer, M.D.    JM/MEDQ  D:  08/03/2002  T:  08/03/2002  Job:  [id number]

## 2011-03-02 ENCOUNTER — Emergency Department (HOSPITAL_COMMUNITY)
Admission: EM | Admit: 2011-03-02 | Discharge: 2011-03-02 | Disposition: A | Discharge status: Home / Self Care | Payer: Self-pay | Attending: Emergency Medicine | Admitting: Emergency Medicine

## 2011-03-02 DIAGNOSIS — M79609 Pain in unspecified limb: Secondary | ICD-10-CM | POA: Insufficient documentation

## 2011-03-02 LAB — BASIC METABOLIC PANEL
BUN: 21 mg/dL (ref 6–23)
CO2: 27 mEq/L (ref 19–32)
Calcium: 9.3 mg/dL (ref 8.4–10.5)
Chloride: 100 mEq/L (ref 96–112)
Creatinine, Ser: 0.61 mg/dL (ref 0.4–1.2)
GFR calc Af Amer: >60 mL/min (ref >60)
GFR calc non Af Amer: >60 mL/min (ref >60)
Glucose, Bld: 97 mg/dL (ref 70–99)
Potassium: 4.4 mEq/L (ref 3.5–5.1)
Sodium: 137 mEq/L (ref 135–145)

## 2011-03-02 LAB — CK: Total CK: 134 U/L (ref 7–177)

## 2011-09-05 ENCOUNTER — Other Ambulatory Visit (HOSPITAL_COMMUNITY): Payer: Self-pay | Admitting: Family Medicine

## 2011-09-05 DIAGNOSIS — Z1231 Encounter for screening mammogram for malignant neoplasm of breast: Secondary | ICD-10-CM

## 2011-10-07 ENCOUNTER — Ambulatory Visit (HOSPITAL_COMMUNITY)
Admission: RE | Admit: 2011-10-07 | Discharge: 2011-10-07 | Disposition: A | Discharge status: Home / Self Care | Payer: Self-pay | Source: Ambulatory Visit | Attending: Family Medicine | Admitting: Family Medicine

## 2011-10-07 DIAGNOSIS — Z1231 Encounter for screening mammogram for malignant neoplasm of breast: Secondary | ICD-10-CM | POA: Insufficient documentation

## 2015-01-29 ENCOUNTER — Ambulatory Visit: Payer: Self-pay | Attending: Internal Medicine | Admitting: Internal Medicine

## 2015-01-29 ENCOUNTER — Encounter: Payer: Self-pay | Admitting: Internal Medicine

## 2015-01-29 VITALS — BP 131/77 | HR 80 | Temp 98.5°F | Resp 16 | Ht 64.0 in | Wt 209.0 lb

## 2015-01-29 DIAGNOSIS — K029 Dental caries, unspecified: Secondary | ICD-10-CM | POA: Insufficient documentation

## 2015-01-29 DIAGNOSIS — Z3046 Encounter for surveillance of implantable subdermal contraceptive: Secondary | ICD-10-CM

## 2015-01-29 DIAGNOSIS — K047 Periapical abscess without sinus: Secondary | ICD-10-CM

## 2015-01-29 DIAGNOSIS — Z4589 Encounter for adjustment and management of other implanted devices: Secondary | ICD-10-CM | POA: Insufficient documentation

## 2015-01-29 DIAGNOSIS — Z308 Encounter for other contraceptive management: Secondary | ICD-10-CM

## 2015-01-29 MED ORDER — TRAMADOL HCL 50 MG PO TABS: 50.0000 mg | ORAL_TABLET | Freq: Three times a day (TID) | ORAL | Status: DC | PRN

## 2015-01-29 MED ORDER — AMOXICILLIN 500 MG PO CAPS: 500.0000 mg | ORAL_CAPSULE | Freq: Three times a day (TID) | ORAL | Status: DC

## 2015-01-29 NOTE — Patient Instructions (Signed)
Etonogestrel implant Qu es este medicamento? El ETONOGESTREL es un dispositivo anticonceptivo (control de la natalidad). Se utiliza para prevenir el embarazo. Se puede utilizar hasta 3 aos. Este medicamento puede ser utilizado para otros usos; si tiene alguna pregunta consulte con su proveedor de atencin mdica o con su farmacutico. MARCAS COMERCIALES DISPONIBLES: Implanon, Nexplanon Qu le debo informar a mi profesional de la salud antes de tomar este medicamento? Necesita saber si usted presenta alguno de los siguientes problemas o situaciones: -sangrado vaginal anormal -enfermedad vascular o cogulos sanguneos -cncer de mama, cervical, heptico -depresin -diabetes -enfermedad de la vescula biliar -dolores de cabeza -enfermedad cardiaca o ataque cardiaco reciente -alta presin sangunea -alto nivel de colesterol -enfermedad renal -enfermedad heptica -convulsiones -fuma tabaco -una reaccin alrgica o inusual al etonogestrel, otras hormonas, anestsicos o antispticos, medicamentos, alimentos, colorantes o conservantes -si est embarazada o buscando quedar embarazada -si est amamantando a un beb Cmo debo utilizar este medicamento? Este dispositivo se inserta debajo de la piel en la cara interna de la parte superior del brazo por un profesional de la salud. Hable con su pediatra para informarse acerca del uso de este medicamento en nios. Puede requerir atencin especial. Sobredosis: Pngase en contacto inmediatamente con un centro toxicolgico o una sala de urgencia si usted cree que haya tomado demasiado medicamento. ATENCIN: Este medicamento es solo para usted. No comparta este medicamento con nadie. Qu sucede si me olvido de una dosis? No se aplica en este caso. Qu puede interactuar con este medicamento? No tome esta medicina con ninguno de los siguientes medicamentos: -amprenavir -bosentano -fosamprenavir Esta medicina tambin puede interactuar con los  siguientes medicamentos: -medicamentos barbitricos para inducir el sueo o tratar convulsiones -ciertos medicamentos para las infecciones micticas tales como quetoconazol e itraconazol -griseofulvina -medicamentos para tratar convulsiones, tales como carbamazepina, felbamato, oxcarbazepina, fenitona, topiramato -modafinil -fenilbutazona -rifampicina -algunos medicamentos para tratar la infeccin por VIH tales como atazanavir, indinavir, lopinavir, nelfinavir, tipranavir, ritonavir -hierba de [place] Puede ser que esta lista no menciona todas las posibles interacciones. Informe a su profesional de la salud de todos los productos a base de hierbas, medicamentos de venta libre o suplementos nutritivos que est tomando. Si usted fuma, consume bebidas alcohlicas o si utiliza drogas ilegales, indqueselo tambin a su profesional de la salud. Algunas sustancias pueden interactuar con su medicamento. A qu debo estar atento al usar este medicamento? Este medicamento no protege contra la infeccin por el VIH (SIDA) o otras enfermedades de transmisin sexual. Usted debe sentir el implante al presionar con las yemas de los dedos sobre la piel donde se insert. Dgale a su mdico si no se siente el implante. Qu efectos secundarios puedo tener al utilizar este medicamento? Efectos secundarios que debe informar a su mdico o a su profesional de la salud tan pronto como sea posible: -reacciones alrgicas como erupcin cutnea, picazn o urticarias, hinchazn de la cara, labios o lengua -ndulos mamarios -cambios en la visin -confusin, dificultad para hablar o entender -orina de color oscura -humor deprimido -sensacin general de estar enfermo o sntomas gripales -heces claras -prdida del apetito, nuseas -dolor en la regin abdominal superior derecha -dolores de cabeza severos -dolor, hinchazon o sensibilidad grave en el abdomen -falta de aliento, dolor en el pecho, hinchazn de la  pierna -seales de un embarazo -entumecimiento o debilidad repentina de la cara, brazo o pierna -dificultad para caminar, mareos, prdida de equilibrio o coordinacin -sangrado o flujo vaginal inusual -cansancio o debilidad inusual -color amarillento de los ojos o la piel   Efectos secundarios que, por lo general, no requieren atencin mdica (debe informarlos a su mdico o a su profesional de la salud si persisten o si son molestos): -acn -dolor de pecho -cambios de peso -tos -fiebre o escalofros -dolor de cabeza -sangrado menstrual irregular -picazn, ardo o flujo vaginal -dolor o dificultad para orinar -dolor de garganta Puede ser que esta lista no menciona todos los posibles efectos secundarios. Comunquese a su mdico por asesoramiento mdico sobre los efectos secundarios. Usted puede informar los efectos secundarios a la FDA por telfono al 1-800-FDA-1088. Dnde debo guardar mi medicina? Este medicamento se administra en hospitales o clnicas y no necesitar guardarlo en su domicilio. ATENCIN: Este folleto es un resumen. Puede ser que no cubra toda la posible informacin. Si usted tiene preguntas acerca de esta medicina, consulte con su mdico, su farmacutico o su profesional de la salud.  2015, [employer]. (2012-04-12 18:26:08)  

## 2015-01-29 NOTE — Progress Notes (Signed)
Patient ID: Robyn [name], female   DOB: 10-Jun-1971, 44 y.o.   MRN: [id number]  [id number]  [id number]  DOB - 10-Jun-1971  CC:  Chief Complaint  Patient presents with  . Establish Care       HPI: Robyn [name] is a 44 y.o. female here today to establish medical care.  Patient has no past medical history. Patient has had Nexplanon since 2010 which was due for removal in 2013. She has been uninsured and was unable to get it removed. She is interested in getting another Nexplanon if possible.  Patient also c/o dental pain. The pain is located along the whole top row of teeth. She has not been to a dentist in several years. The pain is so severe it has been keeping her awake at night. No facial swelling, fevers, chills, or difficulty swallowing.   Patient has No headache, No chest pain, No abdominal pain - No Nausea, No new weakness tingling or numbness, No Cough - SOB.  No Known Allergies History reviewed. No pertinent past medical history. No current outpatient prescriptions on file prior to visit.   No current facility-administered medications on file prior to visit.   History reviewed. No pertinent family history. History   Social History  . Marital Status: Single    Spouse Name: N/A  . Number of Children: N/A  . Years of Education: N/A   Occupational History  . Not on file.   Social History Main Topics  . Smoking status: Never Smoker   . Smokeless tobacco: Not on file  . Alcohol Use: No  . Drug Use: No  . Sexual Activity: No   Other Topics Concern  . Not on file   Social History Narrative  . No narrative on file    Review of Systems: See hPI   Objective:   Filed Vitals:   01/29/15 1512  BP: 131/77  Pulse: 80  Temp: 98.5 F (36.9 C)  Resp: 16    Physical Exam  Constitutional: She is oriented to person, place, and time.  HENT:  Poor dentition, several missing teeth, caries noted Gum swelling along whole top row  Cardiovascular:  Normal rate, regular rhythm and normal heart sounds.   Pulmonary/Chest: Effort normal and breath sounds normal.  Neurological: She is alert and oriented to person, place, and time.  Skin: Skin is warm and dry.     '  No results found for: WBC, HGB, HCT, MCV, PLT Lab Results  Component Value Date   CREATININE 0.61 03/02/2011   BUN 21 03/02/2011   NA 137 03/02/2011   K 4.4 03/02/2011   CL 100 03/02/2011   CO2 27 03/02/2011    No results found for: HGBA1C Lipid Panel  No results found for: CHOL, TRIG, HDL, CHOLHDL, VLDL, LDLCALC     Assessment and plan:   Robyn [name] was seen today for establish care.  Diagnoses and all orders for this visit:  Dental infection Orders: -     Ambulatory referral to Dentistry -     amoxicillin (AMOXIL) 500 MG capsule; Take 1 capsule (500 mg total) by mouth 3 (three) times daily. -     traMADol (ULTRAM) 50 MG tablet; Take 1 tablet (50 mg total) by mouth every 8 (eight) hours as needed. Explained to patient the importance of seeking dental care. Patient given dental referral list and referral placed through [facility]. Explained that there is a 6-8 month waiting list for [facility] provider  Encounter for Nexplanon removal PROCEDURE  NOTE: Nexplanon removal Patient given informed consent, signed copy in the chart.  Appropriate time out taken Local anaesthesia obtained using 1.5 cc of 1% lidocaine with epinephrine in left arm. Nexplanon was removed per manufacturer's directions. Less than 3 cc blood loss. The removal site covered with a  pressure bandage to minimize bruising. There were no complications and the patient tolerated the procedure well.    Keep dressing on and ice area this afternoon. Remove dressing tomorrow.  Due to language barrier, an interpreter was present during the history-taking and subsequent discussion (and for part of the physical exam) with this patient.  Return in about 3 weeks (around 02/19/2015) for Nexplanon insertion .        Holland CommonsKECK, Ermin Parisien, NP-C [facility] [phone] 01/29/2015, 3:28 PM

## 2015-01-29 NOTE — Progress Notes (Signed)
Pt is here to establish care. Pt is wanting to have her implanon removed. Pt needs a dentist referral. Interpreter [place].

## 2015-02-19 ENCOUNTER — Ambulatory Visit: Payer: Self-pay | Admitting: Internal Medicine

## 2015-02-25 ENCOUNTER — Ambulatory Visit: Payer: Self-pay | Attending: Internal Medicine

## 2015-05-11 ENCOUNTER — Ambulatory Visit: Payer: Self-pay | Attending: Internal Medicine | Admitting: Internal Medicine

## 2015-05-11 ENCOUNTER — Encounter: Payer: Self-pay | Admitting: Internal Medicine

## 2015-05-11 VITALS — BP 115/75 | HR 74 | Temp 98.0°F | Resp 16 | Ht 64.0 in | Wt 206.0 lb

## 2015-05-11 DIAGNOSIS — E669 Obesity, unspecified: Secondary | ICD-10-CM | POA: Insufficient documentation

## 2015-05-11 DIAGNOSIS — L853 Xerosis cutis: Secondary | ICD-10-CM | POA: Insufficient documentation

## 2015-05-11 DIAGNOSIS — R1030 Lower abdominal pain, unspecified: Secondary | ICD-10-CM | POA: Insufficient documentation

## 2015-05-11 LAB — CBC
HEMATOCRIT: 38 % (ref 36.0–46.0)
HEMOGLOBIN: 13 g/dL (ref 12.0–15.0)
MCH: 32.6 pg (ref 26.0–34.0)
MCHC: 34.2 g/dL (ref 30.0–36.0)
MCV: 95.2 fL (ref 78.0–100.0)
MPV: 12.3 fL (ref 8.6–12.4)
Platelets: 218 10*3/uL (ref 150–400)
RBC: 3.99 MIL/uL (ref 3.87–5.11)
RDW: 13.1 % (ref 11.5–15.5)
WBC: 11.9 10*3/uL — AB (ref 4.0–10.5)

## 2015-05-11 LAB — POCT URINALYSIS DIPSTICK
BILIRUBIN UA: NEGATIVE
GLUCOSE UA: NEGATIVE
KETONES UA: NEGATIVE
Leukocytes, UA: NEGATIVE
Nitrite, UA: NEGATIVE
Protein, UA: NEGATIVE
RBC UA: NEGATIVE
Urobilinogen, UA: 0.2
pH, UA: 6.5

## 2015-05-11 LAB — BASIC METABOLIC PANEL
BUN: 17 mg/dL (ref 7–25)
CO2: 24 mmol/L (ref 20–31)
CREATININE: 0.7 mg/dL (ref 0.50–1.10)
Calcium: 9.1 mg/dL (ref 8.6–10.2)
Chloride: 100 mmol/L (ref 98–110)
GLUCOSE: 148 mg/dL — AB (ref 65–99)
POTASSIUM: 4.1 mmol/L (ref 3.5–5.3)
Sodium: 138 mmol/L (ref 135–146)

## 2015-05-11 LAB — HEMOGLOBIN A1C
HEMOGLOBIN A1C: 7.6 % — AB (ref <5.7)
Mean Plasma Glucose: 171 mg/dL — ABNORMAL HIGH (ref <117)

## 2015-05-11 MED ORDER — TRIAMCINOLONE ACETONIDE 0.025 % EX OINT: 1.0000 "application " | TOPICAL_OINTMENT | Freq: Two times a day (BID) | CUTANEOUS | Status: DC

## 2015-05-11 NOTE — Progress Notes (Signed)
Patient here for complaints of lower abd pain that she has been experiencing For the past month Patient states there is a strong odor in the morning but throughout the days it gets Better Patient states she has a history of UTI's

## 2015-05-11 NOTE — Progress Notes (Signed)
   Subjective:    Patient ID: Robyn [name], female    DOB: [birth year], 44 y.o.   MRN: [id number]  HPI Comments: LMP; July 24th  Abdominal Pain This is a new problem. The current episode started 1 to 4 weeks ago. The problem has been gradually worsening (over past 2 days). The pain is located in the suprapubic region. The pain is moderate. The quality of the pain is cramping. Pertinent negatives include no constipation, diarrhea, fever, flatus, nausea or vomiting. Associated symptoms comments: Bloated . Nothing aggravates the pain. The pain is relieved by nothing.    Review of Systems  Constitutional: Negative for fever.  Gastrointestinal: Positive for abdominal pain. Negative for nausea, vomiting, diarrhea, constipation and flatus.  All other systems reviewed and are negative.      Objective:   Physical Exam  Constitutional: She is oriented to person, place, and time.  Cardiovascular: Normal rate, regular rhythm and normal heart sounds.   Pulmonary/Chest: Effort normal and breath sounds normal.  Abdominal: Soft. Bowel sounds are normal. She exhibits no distension and no mass. There is tenderness (Lower pelvic). There is no rebound and no guarding.  Neurological: She is alert and oriented to person, place, and time.  Skin: Skin is warm and dry.  Psychiatric: She has a normal mood and affect.      Assessment & Plan:  Pammie was seen today for abdominal pain.  Diagnoses and all orders for this visit:  Lower abdominal pain -     Urinalysis Dipstick--negative -     CBC -     Basic metabolic panel  Unsure patient etiology may use NSAIDs for relief  Obesity -     Hemoglobin A1c--check for diabetes due to patient's weight  Dry skin -     triamcinolone (KENALOG) 0.025 % ointment; Apply 1 application topically 2 (two) times daily.   Return if symptoms worsen or fail to improve.   Ambrose Finland, NP 05/27/2015 9:09 AM

## 2015-05-13 ENCOUNTER — Telehealth: Payer: Self-pay

## 2015-05-13 MED ORDER — METFORMIN HCL ER 500 MG PO TB24: 500.0000 mg | ORAL_TABLET | Freq: Every day | ORAL | Status: DC

## 2015-05-13 NOTE — Telephone Encounter (Signed)
Interpreter line used CarmPorfirio [phone] Patient not available Message left on voice mail to return our call Prescription sent to pharmacy on file

## 2015-05-27 ENCOUNTER — Other Ambulatory Visit: Payer: Self-pay | Admitting: Internal Medicine

## 2015-05-27 DIAGNOSIS — E669 Obesity, unspecified: Secondary | ICD-10-CM | POA: Insufficient documentation

## 2015-05-27 DIAGNOSIS — E1142 Type 2 diabetes mellitus with diabetic polyneuropathy: Secondary | ICD-10-CM | POA: Insufficient documentation

## 2015-05-27 DIAGNOSIS — E6609 Other obesity due to excess calories: Secondary | ICD-10-CM | POA: Insufficient documentation

## 2015-05-27 DIAGNOSIS — E1169 Type 2 diabetes mellitus with other specified complication: Secondary | ICD-10-CM | POA: Insufficient documentation

## 2015-07-17 LAB — GLUCOSE, POCT (MANUAL RESULT ENTRY): POC Glucose: 153 mg/dl — AB (ref 70–99)

## 2015-07-21 ENCOUNTER — Ambulatory Visit: Payer: Self-pay | Attending: Internal Medicine

## 2015-08-03 ENCOUNTER — Encounter: Payer: Self-pay | Admitting: Internal Medicine

## 2015-08-03 ENCOUNTER — Ambulatory Visit: Payer: Self-pay | Attending: Internal Medicine | Admitting: Internal Medicine

## 2015-08-03 VITALS — BP 113/75 | HR 90 | Temp 98.0°F | Resp 16 | Ht 64.0 in | Wt 207.0 lb

## 2015-08-03 DIAGNOSIS — E119 Type 2 diabetes mellitus without complications: Secondary | ICD-10-CM | POA: Insufficient documentation

## 2015-08-03 DIAGNOSIS — L304 Erythema intertrigo: Secondary | ICD-10-CM | POA: Insufficient documentation

## 2015-08-03 DIAGNOSIS — B353 Tinea pedis: Secondary | ICD-10-CM | POA: Insufficient documentation

## 2015-08-03 LAB — POCT GLYCOSYLATED HEMOGLOBIN (HGB A1C): HEMOGLOBIN A1C: 8.1

## 2015-08-03 LAB — GLUCOSE, POCT (MANUAL RESULT ENTRY): POC GLUCOSE: 217 mg/dL — AB (ref 70–99)

## 2015-08-03 MED ORDER — METFORMIN HCL ER 500 MG PO TB24: 500.0000 mg | ORAL_TABLET | Freq: Every day | ORAL | Status: DC

## 2015-08-03 MED ORDER — NYSTATIN 100000 UNIT/GM EX POWD: CUTANEOUS | Status: DC

## 2015-08-03 MED ORDER — GLUCOSE BLOOD VI STRP: ORAL_STRIP | Status: DC

## 2015-08-03 MED ORDER — TRUEPLUS LANCETS 28G MISC: Status: DC

## 2015-08-03 MED ORDER — TRUE METRIX METER W/DEVICE KIT: PACK | Status: DC

## 2015-08-03 MED ORDER — FLUCONAZOLE 150 MG PO TABS: 150.0000 mg | ORAL_TABLET | Freq: Once | ORAL | Status: DC

## 2015-08-03 NOTE — Progress Notes (Signed)
CC: itching   HPI: Robyn [name] is a 44 y.o. female here today for a follow up visit.  Patient has past medical history of diabetes. Patient reports that she has not been taking her Metformin because she never received a call informing her that she was diabetic. She has not been exercising or avoiding high fat/sugar foods. She denies symptoms of polyuria, neuropathy, or hypoglycemia.   Today she is concerned about itching on the bottom of her feet and in her groin that has been present for the past 3 years.    No Known Allergies History reviewed. No pertinent past medical history. Current Outpatient Prescriptions on File Prior to Visit  Medication Sig Dispense Refill  . amoxicillin (AMOXIL) 500 MG capsule Take 1 capsule (500 mg total) by mouth 3 (three) times daily. (Patient not taking: Reported on 08/03/2015) 30 capsule 0  . metFORMIN (GLUCOPHAGE XR) 500 MG 24 hr tablet Take 1 tablet (500 mg total) by mouth daily with breakfast. (Patient not taking: Reported on 08/03/2015) 30 tablet 2  . traMADol (ULTRAM) 50 MG tablet Take 1 tablet (50 mg total) by mouth every 8 (eight) hours as needed. (Patient not taking: Reported on 08/03/2015) 30 tablet 0  . triamcinolone (KENALOG) 0.025 % ointment Apply 1 application topically 2 (two) times daily. 80 g 1   No current facility-administered medications on file prior to visit.   History reviewed. No pertinent family history. Social History   Social History  . Marital Status: Single    Spouse Name: N/A  . Number of Children: N/A  . Years of Education: N/A   Occupational History  . Not on file.   Social History Main Topics  . Smoking status: Never Smoker   . Smokeless tobacco: Not on file  . Alcohol Use: No  . Drug Use: No  . Sexual Activity: No   Other Topics Concern  . Not on file   Social History Narrative    Review of Systems: Other than what is stated in HPI, all other systems are negative.   Objective:   Filed Vitals:    08/03/15 1625  BP: 113/75  Pulse: 90  Temp: 98 F (36.7 C)  Resp: 16    Physical Exam  Constitutional: She is oriented to person, place, and time.  Cardiovascular: Normal rate, regular rhythm and normal heart sounds.   Pulmonary/Chest: Effort normal and breath sounds normal.  Musculoskeletal: She exhibits no edema.  Neurological: She is alert and oriented to person, place, and time.  Skin: Skin is warm and dry. Rash (tinea pedis) noted.  Psychiatric: She has a normal mood and affect.    Lab Results  Component Value Date   WBC 11.9* 05/11/2015   HGB 13.0 05/11/2015   HCT 38.0 05/11/2015   MCV 95.2 05/11/2015   PLT 218 05/11/2015   Lab Results  Component Value Date   CREATININE 0.70 05/11/2015   BUN 17 05/11/2015   NA 138 05/11/2015   K 4.1 05/11/2015   CL 100 05/11/2015   CO2 24 05/11/2015    Lab Results  Component Value Date   HGBA1C 8.10 08/03/2015   Lipid Panel  No results found for: CHOL, TRIG, HDL, CHOLHDL, VLDL, LDLCALC     Assessment and plan:   Anely was seen today for pruritis.  Diagnoses and all orders for this visit:  Type 2 diabetes mellitus without complication, without long-term current use of insulin (HCC) -     Glucose (CBG) -  HgB A1c -     metFORMIN (GLUCOPHAGE XR) 500 MG 24 hr tablet; Take 1 tablet (500 mg total) by mouth daily with breakfast. -     Blood Glucose Monitoring Suppl (TRUE METRIX METER) W/DEVICE KIT; Check sugars daily -     glucose blood (TRUE METRIX BLOOD GLUCOSE TEST) test strip; Use as instructed -     TRUEPLUS LANCETS 28G MISC; Check sugars daily Patient has not been on medication so her A1C has trended up. Went over some long term complication of diabetes if not controlled now. Went over diet changes, exercise, weight loss. Will recheck her in 3 months.  Intertrigo -     nystatin (MYCOSTATIN/NYSTOP) 100000 UNIT/GM POWD; Apply to groin area twice per day -     fluconazole (DIFLUCAN) 150 MG tablet; Take 1 tablet  (150 mg total) by mouth once. One pill today and once again on Saturday---for fungal infection  Tinea pedis of both feet Will treat with diflucan and she may apply a mix of nystatin and kenalog cream to feet twice per day.    Return in about 3 months (around 11/03/2015) for Diabetes Mellitus.       Lance Bosch, [facility] [phone] 08/03/2015, 4:48 PM

## 2015-08-03 NOTE — Patient Instructions (Signed)
La diabetes mellitus y los alimentos (Diabetes Mellitus and Food) Es importante que controle su nivel de azcar en la sangre (glucosa). El nivel de glucosa en sangre depende en gran medida de lo que usted come. Comer alimentos saludables en las cantidades [place] a lo largo del Training and development officer, aproximadamente a la misma hora [employer], lo ayudar a Chief Technology Officer su nivel de Multimedia programmer. Tambin puede ayudarlo a retrasar o Patent attorney de la diabetes mellitus. Comer de [employer] saludable incluso puede ayudarlo a Chartered loss adjuster de presin arterial y a Science writer o Theatre manager un peso saludable.  Entre las recomendaciones generales para alimentarse y Audiological scientist los alimentos de forma saludable, se incluyen las siguientes:  Respetar las comidas principales y comer colaciones con regularidad. Evitar pasar largos perodos sin comer con el fin de perder peso.  Seguir una dieta que consista principalmente en alimentos de origen vegetal, como frutas, vegetales, frutos secos, legumbres y cereales integrales.  Utilizar mtodos de coccin a baja temperatura, como hornear, en lugar de mtodos de coccin a alta temperatura, como frer en abundante aceite. Trabaje con el nutricionista para aprender a Financial planner nutricional de las etiquetas de los alimentos. CMO PUEDEN AFECTARME LOS ALIMENTOS? Carbohidratos Los carbohidratos afectan el nivel de glucosa en sangre ms que cualquier otro tipo de alimento. El nutricionista lo ayudar a Teacher, adult education cuntos carbohidratos puede consumir en cada comida y ensearle a contarlos. El recuento de carbohidratos es importante para mantener la glucosa en sangre en un nivel saludable, en especial si utiliza insulina o toma determinados medicamentos para la diabetes mellitus. Alcohol El alcohol puede provocar disminuciones sbitas de la glucosa en sangre (hipoglucemia), en especial si utiliza insulina o toma determinados medicamentos para la diabetes mellitus. La  hipoglucemia es una afeccin que puede poner en peligro la vida. Los sntomas de la hipoglucemia (somnolencia, mareos y Data processing manager) son similares a los sntomas de haber consumido mucho alcohol.  Si el mdico lo autoriza a beber alcohol, hgalo con moderacin y siga estas pautas:  Las mujeres no deben beber ms de un trago por da, y los hombres no deben beber ms de dos tragos por Training and development officer. Un trago es igual a:  12 onzas (355 ml) de cerveza  5 onzas de vino (150 ml) de vino  1,5onzas (23m) de bebidas espirituosas  No beba con el estmago vaco.  Mantngase hidratado. Beba agua, gaseosas dietticas o t helado sin azcar.  Las gaseosas comunes, los jugos y otros refrescos podran contener muchos carbohidratos y se dCivil Service fast streamer QU ALIMENTOS NO SE RECOMIENDAN? Cuando haga las elecciones de alimentos, es importante que recuerde que todos los alimentos son distintos. Algunos tienen menos nutrientes que otros por porcin, aunque podran tener la misma cantidad de caloras o carbohidratos. Es difcil darle al cuerpo lo que necesita cuando consume alimentos con menos nutrientes. Estos son algunos ejemplos de alimentos que debera evitar ya que contienen muchas caloras y carbohidratos, pero pocos nutrientes:  GPhysicist, medicaltrans (la mayora de los alimentos procesados incluyen grasas trans en la etiqueta de Informacin nutricional).  Gaseosas comunes.  Jugos.  Caramelos.  Dulces, como tortas, pasteles, rosquillas y [place]  Comidas fritas. QU ALIMENTOS PUEDO COMER? Consuma alimentos ricos en nutrientes, que nutrirn el cuerpo y lo mantendrn saludable. Los alimentos que debe comer tambin dependern de varios factores, como:  Las caloras que necesita.  Los medicamentos que toma.  Su peso.  El nivel de glucosa en sMarist College  El nArrow Rockde presin arterial.  El nivel de colesterol.  Debe consumir una amplia variedad de alimentos, por ejemplo:  Protenas.  Cortes de [employer].  Protenas con bajo contenido de grasas saturadas, como pescado, clara de huevo y frijoles. Evite las carnes procesadas.  Frutas y vegetales.  Frutas y Photographer que pueden ayudar a Chief Technology Officer los niveles sanguneos de [place], como [place], mangos y batatas.  Productos lcteos.  Elija productos lcteos sin grasa o con bajo contenido de [place], como [place], yogur y [place].  Cereales, panes, pastas y arroz.  Elija cereales integrales, como panes multicereales, avena en grano y arroz integral. Estos alimentos pueden ayudar a controlar la presin arterial.  Daphene Jaeger.  Alimentos que contengan grasas saludables, como frutos secos, Musician, aceite de [place], aceite de canola y pescado. TODOS LOS QUE PADECEN DIABETES MELLITUS TIENEN EL [place] PLAN DE [place]? Dado que todas las personas que padecen diabetes mellitus son distintas, no hay un solo plan de comidas que funcione para todos. Es muy importante que se rena con un nutricionista que lo ayudar a crear un plan de comidas adecuado para usted.   Esta informacin no tiene Marine scientist el consejo del mdico. Asegrese de hacerle al mdico cualquier pregunta que tenga.   Document Released: 12/13/2007 Document Revised: 09/26/2014 Elsevier Interactive Patient Education [employer].  Diabetes y normas bsicas de atencin mdica (Diabetes and Standards of Medical Care) La diabetes es una enfermedad complicada. El equipo que trate su diabetes deber incluir un nutricionista, un enfermero, un educador para la diabetes, un oftalmlogo y ms. Para que todas las personas conozcan sobre su enfermedad y para que los pacientes tengan los cuidados que necesitan, se crearon las siguientes normas bsicas para un mejor control. A continuacin se indican los estudios, vacunas, medicamentos, educacin y planes que necesitar. Prueba de HbA1c Esta prueba muestra cmo ha sido controlada su glucosa en los ltimos 2 o 3 meses. Se utiliza para  verificar si el plan de control de la diabetes debe ser ajustado.   Hgalos al menos 2 veces al ao si cumple los objetivos del [place].  Si le han cambiado el tratamiento o si no cumple con los objetivos del [place], debe hacerlo 4 veces al ao. Control de la presin arterial.  Hgalas en cada visita mdica de rutina. El objetivo es tener menos de 140/90 mm Hg en la mayora de las personas, pero 130/80 mm Hg en algunos casos. Consulte a su mdico acerca de su objetivo. Examen dental.  Concurra regularmente a las visitas de control con el dentista. Examen ocular.  Si le diagnosticaron diabetes tipo 1 siendo un nio, debe hacerse estudios al llegar a los 10 aos o ms y si ha sufrido de diabetes durante 3 a 5 aos. Se recomienda hacer anualmente los exmenes oculares despus de ese examen inicial.  Si le diagnosticaron diabetes tipo 1 siendo adulto, hgase un examen dentro de los 5 aos del diagnstico y luego una vez por ao.  Si le diagnosticaron diabetes tipo 2, hgase un estudio lo antes posible despus del diagnstico y luego una vez por ao. Examen de los pies  Se har una inspeccin visual en cada visita mdica de rutina. Estos controles observarn si hay cortes, lesiones u otros problemas en los pies.  Debe realizarse un examen completo de los pies cada ao. Esto incluye revisar la estructura y la piel de los pies, y examinar los pulsos y la sensacin de los pies.  Diabetes tipo 1: La primera prueba se realiza 5 aos despus del diagnstico.  Diabetes  tipo 2: La primera prueba se realiza en el momento del diagnstico.  Contrlese los pies todas las noches para ver si hay cortes, lesiones u otros problemas. Comunquese con su mdico si observa que no se curan. Estudio de la funcin renal (microalbmina en orina)  Debe realizarse una vez por ao.  Diabetes tipo 1: La primera prueba se realiza a los 5 aos despus del diagnstico.  Diabetes tipo2: La primera prueba se  realiza en el momento del diagnstico.  La creatinina srica y el ndice de filtracin glomerular estimada (eGFR, por sus siglas en ingls) se realizan una vez por ao para informar el nivel de enfermedad renal crnica, si la hubiera. Perfil lipdico (colesterol, HDL, LDL, triglicridos).  La mayora de las personas lo hacen cada 5 aos.  En relacin al LDL, el objetivo es tener menos de 100 mg/dl. Si tiene alto riesgo, el objetivo es tener menos de 70 mg/dl.  En relacin al HDL, el objetivo es [employer] 40 y 59 mg/dl para los hombres y entre 65 y 33 mg/dl para las mujeres. Un nivel de colesterol HDL de 60 mg/dl o superior da una cierta proteccin contra la enfermedad cardaca.  En relacin a los triglicridos, el objetivo es tener menos de 150 mg/dl. Vacunas  Se recomienda aplicar de forma anual la vacuna contra la gripe a todas las personas de 6 meses en adelante que tengan diabetes.  La vacuna contra la neumona (antineumoccica) est recomendada para todas las personas de 2 aos en adelante que tengan diabetes. Los adultos de 65 aos o ms pueden recibir la vacuna antineumoccica como una serie de dos inyecciones diferentes.  Se recomienda administrar la vacuna contra la hepatitis B en adultos poco despus de que hayan recibido el diagnstico de diabetes.  La vacuna Tdap (contra el ttanos, la difteria y la tosferina) debe aplicarse de la siguiente manera:  Segn las pautas normales de vacunacin infantil en el caso de los nios.  Cada 10 aos en el caso de los adultos con diabetes. Educacin para el autocontrol de la diabetes  Recomendaciones al momento del diagnstico y los controles segn sea necesario. Plan de tratamiento  Su plan de tratamiento ser revisado en cada visita mdica.   Esta informacin no tiene Marine scientist el consejo del mdico. Asegrese de hacerle al mdico cualquier pregunta que tenga.   Document Released: 11/30/2009 Document Revised:  09/26/2014 Elsevier Interactive Patient Education [employer].

## 2015-08-03 NOTE — Progress Notes (Signed)
Patient here  For follow up  Complains of having itching to the bottoms of her feet and in  Her groin area as well

## 2017-03-11 LAB — GLUCOSE, POCT (MANUAL RESULT ENTRY): POC Glucose: 357 mg/dl — AB (ref 70–99)

## 2017-03-21 ENCOUNTER — Encounter (INDEPENDENT_AMBULATORY_CARE_PROVIDER_SITE_OTHER): Payer: Self-pay | Admitting: Physician Assistant

## 2017-03-21 ENCOUNTER — Ambulatory Visit (INDEPENDENT_AMBULATORY_CARE_PROVIDER_SITE_OTHER): Payer: Self-pay | Admitting: Physician Assistant

## 2017-03-21 VITALS — BP 128/75 | HR 71 | Temp 98.8°F | Resp 18 | Ht 64.0 in | Wt 202.0 lb

## 2017-03-21 DIAGNOSIS — E1169 Type 2 diabetes mellitus with other specified complication: Secondary | ICD-10-CM

## 2017-03-21 DIAGNOSIS — R1030 Lower abdominal pain, unspecified: Secondary | ICD-10-CM

## 2017-03-21 DIAGNOSIS — E669 Obesity, unspecified: Secondary | ICD-10-CM

## 2017-03-21 DIAGNOSIS — K5900 Constipation, unspecified: Secondary | ICD-10-CM

## 2017-03-21 LAB — GLUCOSE, POCT (MANUAL RESULT ENTRY): POC Glucose: 257 mg/dl — AB (ref 70–99)

## 2017-03-21 LAB — POCT GLYCOSYLATED HEMOGLOBIN (HGB A1C): Hemoglobin A1C: 10.8

## 2017-03-21 MED ORDER — METFORMIN HCL 1000 MG PO TABS: 1000.0000 mg | ORAL_TABLET | Freq: Two times a day (BID) | ORAL | 11 refills | Status: DC

## 2017-03-21 MED ORDER — BLOOD GLUCOSE MONITOR KIT: PACK | 0 refills | Status: DC

## 2017-03-21 MED ORDER — DOCUSATE SODIUM 50 MG PO CAPS: 50.0000 mg | ORAL_CAPSULE | Freq: Two times a day (BID) | ORAL | 2 refills | Status: DC

## 2017-03-21 NOTE — Progress Notes (Signed)
Subjective:  Patient ID: Robyn [name], female    DOB: 21-Aug-1971  Age: 46 y.o. MRN: [id number]  CC: establish care  HPI Robyn [name] is a 46 y.o. female with a medical history of DM2 presents with concern of having diabetes. Says she is unclear as to whether or not she has diabetes. Last A1c 8.1% on 08/02/17. Previously prescribed Metformin 500 mg XR but never took it or any other anti-glycemic. Endorses visual blurring and occasional fatigue. Denies polydipsia, polyuria, polyphagia, tingling, numbness, currently exercising, or following a low carb diet.     Also complains of occasional abdominal pain in the hypogastric region. Onset three years ago. Occurs occasionally and is severe. Endorses chronic constipation that is relieved with [employer] products. Does not endorse hx of GYN issues but admits to never having gone to GYN for her health maintenance.    ROS Review of Systems  Constitutional: Positive for malaise/fatigue. Negative for chills and fever.  Eyes: Positive for blurred vision.  Respiratory: Negative for shortness of breath.   Cardiovascular: Negative for chest pain and palpitations.  Gastrointestinal: Positive for abdominal pain. Negative for nausea.  Genitourinary: Negative for dysuria and hematuria.  Musculoskeletal: Negative for joint pain and myalgias.  Skin: Negative for rash.  Neurological: Negative for tingling and headaches.  Psychiatric/Behavioral: Negative for depression. The patient is not nervous/anxious.     Objective:  BP 128/75 (BP Location: Right Arm, Patient Position: Sitting, Cuff Size: Large)   Pulse 71   Temp 98.8 F (37.1 C) (Oral)   Resp 18   Ht _0  (1.626 m)   Wt 202 lb (91.6 kg)   SpO2 99%   BMI 34.67 kg/m   BP/Weight 03/21/2017 08/03/2015 40/81/4481  Systolic BP 856 314 970  Diastolic BP 75 75 76  Wt. (Lbs) 202 207 201  BMI 34.67 35.51 35.61      Physical Exam  Constitutional: She is oriented to person, place,  and time.  Well developed, obese, NAD, reserved  HENT:  Head: Normocephalic and atraumatic.  Eyes: No scleral icterus.  Cardiovascular: Normal rate, regular rhythm and normal heart sounds.   Pulmonary/Chest: Effort normal and breath sounds normal.  Abdominal: Soft. Bowel sounds are normal. She exhibits no distension. There is no tenderness.  Firm mass felt in the hypogastric region, palpation did not elicit pain.   Musculoskeletal: She exhibits no edema.  Neurological: She is alert and oriented to person, place, and time. No cranial nerve deficit. Coordination normal.  Skin: Skin is warm and dry. No rash noted. No erythema. No pallor.  Psychiatric: She has a normal mood and affect. Her behavior is normal. Thought content normal.  Vitals reviewed.    Assessment & Plan:   1. Diabetes mellitus type 2 in obese (HCC) - HgB A1c 10.8% in clinic today - Glucose (CBG) 257 in clinic today - Begin blood glucose meter kit and supplies KIT; Dispense based on patient and insurance preference. Use up to four times daily as directed. (FOR ICD-9 250.00, 250.01).  Dispense: 1 each; Refill: 0 - Begin Metformin 1000 mg BID - Counseling on low carb diet given to patient with examples of switching to wheat based foods, eating brown rice, abstaining from sweets, abstaining from sugary drinks.    2. Intermittent lower abdominal pain - US Pelvis Complete; Future - US Transvaginal Non-OB; Future  3. Constipation, unspecified constipation type - Begin docusate sodium (COLACE) 50 MG capsule; Take 1 capsule (50 mg total) by mouth 2 (two) times daily.  Dispense: 10 capsule; Refill: 2   Meds ordered this encounter  Medications  . docusate sodium (COLACE) 50 MG capsule    Sig: Take 1 capsule (50 mg total) by mouth 2 (two) times daily.    Dispense:  10 capsule    Refill:  2    Order Specific Question:   Supervising Provider    Answer:   Tresa Garter [id number]  . blood glucose meter kit and supplies  KIT    Sig: Dispense based on patient and insurance preference. Use up to four times daily as directed. (FOR ICD-9 250.00, 250.01).    Dispense:  1 each    Refill:  0    Order Specific Question:   Supervising Provider    Answer:   Tresa Garter [[id number]]    Order Specific Question:   Number of strips    Answer:   100    Order Specific Question:   Number of lancets    Answer:   100    Follow-up: Return in about 4 weeks (around 04/18/2017) for DM.   Clent Demark PA

## 2017-03-21 NOTE — Patient Instructions (Addendum)
La diabetes mellitus y los alimentos (Diabetes Mellitus and Food) Es importante que controle su nivel de azcar en la sangre (glucosa). El nivel de glucosa en sangre depende en gran medida de lo que usted come. Comer alimentos saludables en las cantidades [place] a lo largo del Training and development officer, aproximadamente a la misma hora [employer], lo ayudar a Chief Technology Officer su nivel de Multimedia programmer. Tambin puede ayudarlo a retrasar o Patent attorney de la diabetes mellitus. Comer de [employer] saludable incluso puede ayudarlo a Chartered loss adjuster de presin arterial y a Science writer o Theatre manager un peso saludable. Entre las recomendaciones generales para alimentarse y Audiological scientist los alimentos de forma saludable, se incluyen las siguientes:  Respetar las comidas principales y comer colaciones con regularidad. Evitar pasar largos perodos sin comer con el fin de perder peso.  Seguir una dieta que consista principalmente en alimentos de origen vegetal, como frutas, vegetales, frutos secos, legumbres y cereales integrales.  Utilizar mtodos de coccin a baja temperatura, como hornear, en lugar de mtodos de coccin a alta temperatura, como frer en abundante aceite. Trabaje con el nutricionista para aprender a Financial planner nutricional de las etiquetas de los alimentos. CMO PUEDEN AFECTARME LOS ALIMENTOS? Carbohidratos Los carbohidratos afectan el nivel de glucosa en sangre ms que cualquier otro tipo de alimento. El nutricionista lo ayudar a Teacher, adult education cuntos carbohidratos puede consumir en cada comida y ensearle a contarlos. El recuento de carbohidratos es importante para mantener la glucosa en sangre en un nivel saludable, en especial si utiliza insulina o toma determinados medicamentos para la diabetes mellitus. Alcohol El alcohol puede provocar disminuciones sbitas de la glucosa en sangre (hipoglucemia), en especial si utiliza insulina o toma determinados medicamentos para la diabetes mellitus. La  hipoglucemia es una afeccin que puede poner en peligro la vida. Los sntomas de la hipoglucemia (somnolencia, mareos y Data processing manager) son similares a los sntomas de haber consumido mucho alcohol. Si el mdico lo autoriza a beber alcohol, hgalo con moderacin y siga estas pautas:  Las mujeres no deben beber ms de un trago por da, y los hombres no deben beber ms de dos tragos por Training and development officer. Un trago es igual a: ? 12 onzas (355 ml) de cerveza ? 5 onzas de vino (150 ml) de vino ? 1,5onzas (41ml) de bebidas espirituosas  No beba con el estmago vaco.  Mantngase hidratado. Beba agua, gaseosas dietticas o t helado sin azcar.  Las gaseosas comunes, los jugos y otros refrescos podran contener muchos carbohidratos y se Civil Service fast streamer. QU ALIMENTOS NO SE RECOMIENDAN? Cuando haga las elecciones de alimentos, es importante que recuerde que todos los alimentos son distintos. Algunos tienen menos nutrientes que otros por porcin, aunque podran tener la misma cantidad de caloras o carbohidratos. Es difcil darle al cuerpo lo que necesita cuando consume alimentos con menos nutrientes. Estos son algunos ejemplos de alimentos que debera evitar ya que contienen muchas caloras y carbohidratos, pero pocos nutrientes:  Physicist, medical trans (la mayora de los alimentos procesados incluyen grasas trans en la etiqueta de Informacin nutricional).  Gaseosas comunes.  Jugos.  Caramelos.  Dulces, como tortas, pasteles, rosquillas y [place].  Comidas fritas. QU ALIMENTOS PUEDO COMER? Consuma alimentos ricos en nutrientes, que nutrirn el cuerpo y lo mantendrn saludable. Los alimentos que debe comer tambin dependern de varios factores, como:  Las caloras que necesita.  Los medicamentos que toma.  Su peso.  El nivel de glucosa en [place].  El [place] de presin arterial.  El nivel de colesterol. Debe consumir  una amplia variedad de alimentos, por ejemplo:  Protenas. ? Cortes de PPL Corporationcarne  magros. ? Protenas con bajo contenido de grasas saturadas, como pescado, clara de huevo y frijoles. Evite las carnes procesadas.  Frutas y vegetales. ? [place] IslandFrutas y Sports administratorvegetales que pueden ayudar a AGCO Corporationcontrolar los niveles sanguneos de [place] Heightsglucosa, como [place]manzanas, mangos y batatas.  Productos lcteos. ? Elija productos lcteos sin grasa o con bajo contenido de [place]grasa, como [place], yogur y [place]o.  Cereales, panes, pastas y arroz. ? Elija cereales integrales, como panes multicereales, avena en grano y arroz integral. Estos alimentos pueden ayudar a controlar la presin arterial.  Rosalin HawkingGrasas. ? Alimentos que contengan grasas saludables, como frutos secos, Chartered certified accountantaguacate, aceite de Murphyoliva, aceite de canola y pescado. TODOS LOS QUE PADECEN DIABETES MELLITUS TIENEN EL MISMO PLAN DE COMIDAS? Dado que todas las personas que padecen diabetes mellitus son distintas, no hay un solo plan de comidas que funcione para todos. Es muy importante que se rena con un nutricionista que lo ayudar a crear un plan de comidas adecuado para usted. Esta informacin no tiene Theme park managercomo fin reemplazar el consejo del mdico. Asegrese de hacerle al mdico cualquier pregunta que tenga. Document Released: 12/13/2007 Document Revised: 09/26/2014 Document Reviewed: 08/02/2013 Elsevier Interactive Patient Education  2017 Elsevier Inc.  Constipacin en los adultos (Constipation, Adult) Constipacin significa que una persona defeca en una semana menos que lo normal, hay dificultad para defecar, o las heces son secas, duras, o ms grandes que lo normal. La causa puede ser una afeccin subyacente. Puede empeorar con la edad si una persona toma ciertos medicamentos y no toma suficiente lquido. INSTRUCCIONES PARA EL CUIDADO EN EL HOGAR Comida y bebida  Consuma alimentos con alto contenido de [place]fibra, como frutas y verduras frescas, cereales integrales y frijoles.  Limite los alimentos ricos en grasas y con bajo contenido de [place]fibra, o muy procesados, como las  papas fritas, [place]uesas, Centervillegalletas, dulces y refrescos.  Beba suficiente lquido para Photographermantener la orina clara o de color amarillo plido. Instrucciones generales  Haga actividad fsica habitualmente o como se lo haya indicado el mdico.  Vaya al bao cuando sienta la necesidad de ir. No se aguante las ganas.  Tome los medicamentos de venta libre y los recetados solamente como se lo haya indicado el mdico. Estos incluyen los suplementos de [place]fibra.  Practique ejercicios de rehabilitacin del suelo plvico, como la respiracin profunda mientras relaja la parte inferior del abdomen y relajacin del suelo plvico mientras defeca.  Controle su afeccin para ver si hay cambios.  Concurra a todas las visitas de control como se lo haya indicado el mdico. Esto es importante. SOLICITE ATENCIN MDICA SI:  Siente un dolor que empeora.  Tiene fiebre.  No defeca despus de 4das.  Vomita.  No tiene hambre.  Pierde peso.  Tiene una hemorragia en el ano.  Las heces son delgadas como un lpiz.  SOLICITE ATENCIN MDICA DE INMEDIATO SI:  Tiene fiebre y los sntomas empeoran repentinamente.  Observa que se filtran heces o hay sangre en las heces.  Tiene el abdomen hinchado.  Siente un dolor intenso en el abdomen.  Se siente mareado o se desmaya.  Esta informacin no tiene Theme park managercomo fin reemplazar el consejo del mdico. Asegrese de hacerle al mdico cualquier pregunta que tenga. Document Released: 09/25/2007 Document Revised: 09/26/2014 Document Reviewed: 02/24/2016 Elsevier Interactive Patient Education  2017 [employer] Inc.

## 2017-03-29 ENCOUNTER — Other Ambulatory Visit: Payer: Self-pay

## 2017-04-14 ENCOUNTER — Ambulatory Visit (INDEPENDENT_AMBULATORY_CARE_PROVIDER_SITE_OTHER): Payer: Self-pay

## 2017-04-20 ENCOUNTER — Ambulatory Visit (INDEPENDENT_AMBULATORY_CARE_PROVIDER_SITE_OTHER): Payer: Self-pay | Admitting: Physician Assistant

## 2018-07-09 ENCOUNTER — Ambulatory Visit: Payer: Self-pay | Attending: Family Medicine

## 2018-07-16 ENCOUNTER — Encounter (INDEPENDENT_AMBULATORY_CARE_PROVIDER_SITE_OTHER): Payer: Self-pay | Admitting: Physician Assistant

## 2018-07-16 ENCOUNTER — Ambulatory Visit (INDEPENDENT_AMBULATORY_CARE_PROVIDER_SITE_OTHER): Payer: Self-pay | Admitting: Physician Assistant

## 2018-07-16 VITALS — BP 143/83 | HR 93 | Temp 97.8°F | Resp 16 | Ht 64.0 in | Wt 199.0 lb

## 2018-07-16 DIAGNOSIS — Z9119 Patient's noncompliance with other medical treatment and regimen: Secondary | ICD-10-CM

## 2018-07-16 DIAGNOSIS — E669 Obesity, unspecified: Secondary | ICD-10-CM

## 2018-07-16 DIAGNOSIS — E1169 Type 2 diabetes mellitus with other specified complication: Secondary | ICD-10-CM

## 2018-07-16 DIAGNOSIS — Z91199 Patient's noncompliance with other medical treatment and regimen due to unspecified reason: Secondary | ICD-10-CM

## 2018-07-16 LAB — POCT GLYCOSYLATED HEMOGLOBIN (HGB A1C): HEMOGLOBIN A1C: 12.4 % — AB (ref 4.0–5.6)

## 2018-07-16 MED ORDER — GLIMEPIRIDE 2 MG PO TABS: 2.0000 mg | ORAL_TABLET | Freq: Every day | ORAL | 1 refills | Status: DC

## 2018-07-16 MED ORDER — METFORMIN HCL 1000 MG PO TABS: 1000.0000 mg | ORAL_TABLET | Freq: Two times a day (BID) | ORAL | 1 refills | Status: DC

## 2018-07-16 NOTE — Progress Notes (Signed)
Pt. Is here to follow-up on diabetes.  Pt. Stated she have not taken her Metformin for 3 months due to CAFA and orange card.

## 2018-07-16 NOTE — Progress Notes (Signed)
Subjective:  Patient ID: Robyn [name], female    DOB: 11-28-1970  Age: 47 y.o. MRN: [id number]  CC: f/u DM2   HPI  Robyn [name] is a 47 y.o. female with a medical history of DM2 presents to f/u on DM2. Last A1c 10.8% three months ago. Was not taking Metformin and was advised to begin taking Metformin. She returns today and is still not taking Metformin. Says she went to an outside provider and was told she did not need metformin and advised to lose weight. Pt exercises 4 times a week in Zumba class. Tries to eat a low carb diet. A1c 12.4% today. Does not endorse polydipsia, polyuria, visual blurring, tingling, numbness, abdominal pain, f/c/n/v, or cognitive difficulties.      Outpatient Medications Prior to Visit  Medication Sig Dispense Refill  . Blood Glucose Monitoring Suppl (TRUE METRIX METER) W/DEVICE KIT Check sugars daily 1 kit 0  . glucose blood (TRUE METRIX BLOOD GLUCOSE TEST) test strip Use as instructed 100 each 12  . metFORMIN (GLUCOPHAGE) 1000 MG tablet Take 1 tablet (1,000 mg total) by mouth 2 (two) times daily with a meal. 60 tablet 11  . TRUEPLUS LANCETS 28G MISC Check sugars daily 100 each 1  . blood glucose meter kit and supplies KIT Dispense based on patient and insurance preference. Use up to four times daily as directed. (FOR ICD-9 250.00, 250.01). (Patient not taking: Reported on 07/16/2018) 1 each 0  . docusate sodium (COLACE) 50 MG capsule Take 1 capsule (50 mg total) by mouth 2 (two) times daily. (Patient not taking: Reported on 07/16/2018) 10 capsule 2  . nystatin (MYCOSTATIN/NYSTOP) 100000 UNIT/GM POWD Apply to groin area twice per day (Patient not taking: Reported on 07/16/2018) 60 g 1  . triamcinolone (KENALOG) 0.025 % ointment Apply 1 application topically 2 (two) times daily. (Patient not taking: Reported on 07/16/2018) 80 g 1   No facility-administered medications prior to visit.      ROS Review of Systems  Constitutional: Negative  for chills, fever and malaise/fatigue.  Eyes: Negative for blurred vision.  Respiratory: Negative for shortness of breath.   Cardiovascular: Negative for chest pain and palpitations.  Gastrointestinal: Negative for abdominal pain and nausea.  Genitourinary: Negative for dysuria and hematuria.  Musculoskeletal: Negative for joint pain and myalgias.  Skin: Negative for rash.  Neurological: Negative for tingling and headaches.  Psychiatric/Behavioral: Negative for depression. The patient is not nervous/anxious.     Objective:  BP (!) 143/83 (BP Location: Left Arm, Patient Position: Sitting, Cuff Size: Normal)   Pulse 93   Temp 97.8 F (36.6 C) (Oral)   Resp 16   Ht 5' 4"  (1.626 m)   Wt 199 lb (90.3 kg)   LMP 07/07/2018   SpO2 99%   BMI 34.16 kg/m   BP/Weight 07/16/2018 03/21/2017 4/43/1540  Systolic BP 086 761 950  Diastolic BP 83 75 87  Wt. (Lbs) 199 202 -  BMI 34.16 34.67 -      Physical Exam  Constitutional: She is oriented to person, place, and time.  Well developed, overweight, NAD, polite  HENT:  Head: Normocephalic and atraumatic.  Eyes: No scleral icterus.  Neck: Normal range of motion. Neck supple. No thyromegaly present.  Cardiovascular: Normal rate, regular rhythm and normal heart sounds.  Pulmonary/Chest: Effort normal and breath sounds normal.  Musculoskeletal: She exhibits no edema.  Neurological: She is alert and oriented to person, place, and time.  Skin: Skin is warm and dry. No  rash noted. No erythema. No pallor.  Psychiatric: She has a normal mood and affect. Her behavior is normal. Thought content normal.  Vitals reviewed.    Assessment & Plan:   1. Diabetes mellitus type 2 in obese (HCC) - HgB A1c 12.4%, up from 10.8% three months ago. Noncompliant. - Begin (2nd rx) metFORMIN (GLUCOPHAGE) 1000 MG tablet; Take 1 tablet (1,000 mg total) by mouth 2 (two) times daily with a meal.  Dispense: 180 tablet; Refill: 1 - Lipid panel; Future -  Comprehensive metabolic panel; Future   2. Medically noncompliant - Pt not taking Metformin at all. I have strongly advised patient to take her prescribed medications as indicated. I have warned her of the consequences of hyperglycemia to include coma.    Meds ordered this encounter  Medications  . metFORMIN (GLUCOPHAGE) 1000 MG tablet    Sig: Take 1 tablet (1,000 mg total) by mouth 2 (two) times daily with a meal.    Dispense:  180 tablet    Refill:  1    Order Specific Question:   Supervising Provider    Answer:   Charlott Rakes [[id number]]  . glimepiride (AMARYL) 2 MG tablet    Sig: Take 1 tablet (2 mg total) by mouth daily before breakfast.    Dispense:  90 tablet    Refill:  1    Order Specific Question:   Supervising Provider    Answer:   Charlott Rakes [[id number]]    Follow-up: Return in about 3 months (around 10/16/2018) for DM2.   Clent Demark PA

## 2018-07-16 NOTE — Patient Instructions (Signed)
Diabetes mellitus y nutrición  Diabetes Mellitus and Nutrition  Si sufre de diabetes (diabetes mellitus), es muy importante tener hábitos alimenticios saludables debido a que sus niveles de azúcar en la sangre (glucosa) se ven afectados en gran medida por lo que come y bebe. Comer alimentos saludables en las cantidades adecuadas, aproximadamente a la misma hora todos los días, lo ayudará a:  · Controlar la glucemia.  · Disminuir el riesgo de sufrir una enfermedad cardíaca.  · Mejorar la presión arterial.  · Alcanzar o mantener un peso saludable.    Todas las personas que sufren de diabetes son diferentes y cada una tiene necesidades diferentes en cuanto a un plan de alimentación. El médico puede recomendarle que trabaje con un especialista en dietas y nutrición (nutricionista) para elaborar el mejor plan para usted. Su plan de alimentación puede variar según factores como:  · Las calorías que necesita.  · Los medicamentos que toma.  · Su peso.  · Sus niveles de glucemia, presión arterial y colesterol.  · Su nivel de actividad.  · Otras afecciones que tenga, como enfermedades cardíacas o renales.    ¿Cómo me afectan los carbohidratos?  Los carbohidratos afectan el nivel de glucemia más que cualquier otro tipo de alimento. La ingesta de carbohidratos naturalmente aumenta la cantidad glucosa en la sangre. El recuento de carbohidratos es un método destinado a llevar un registro de la cantidad de carbohidratos que se ingieren. El recuento de carbohidratos es importante para mantener la glucemia a un nivel saludable, en especial si utiliza insulina o toma determinados medicamentos por vía oral para la diabetes.  Es importante saber la cantidad de carbohidratos que se pueden ingerir en cada comida sin correr ningún riesgo. Esto es diferente en cada persona. El nutricionista puede ayudarlo a calcular la cantidad de carbohidratos que debe ingerir en cada comida y colación.   Los alimentos que contienen carbohidratos incluyen:  · Pan, cereal, arroz, pasta y galletas.  · Papas y maíz.  · Guisantes, frijoles y lentejas.  · Leche y yogur.  · Frutas y jugo.  · Postres, como pasteles, galletitas, helado y caramelos.    ¿Cómo me afecta el alcohol?  El alcohol puede provocar disminuciones súbitas de la glucemia (hipoglucemia), en especial si utiliza insulina o toma determinados medicamentos por vía oral para la diabetes. La hipoglucemia es una afección potencialmente mortal. Los síntomas de la hipoglucemia (somnolencia, mareos y confusión) son similares a los síntomas de haber consumido demasiado alcohol.  Si el médico afirma que el alcohol es seguro para usted, siga estas pautas:  · Limite el consumo de alcohol a no más de 1 medida por día si es mujer y no está embarazada, y a 2 medidas si es hombre. Una medida equivale a 12 oz (355 ml) de cerveza, 5 oz (148 ml) de vino o 1½ oz (44 ml) de bebidas de alta graduación alcohólica.  · No beba con el estómago vacío.  · Manténgase hidratado con agua, gaseosas dietéticas o té helado sin azúcar.  · Tenga en cuenta que las gaseosas comunes, los jugos y otros refrescos pueden contener mucha azúcar y se deben contar como carbohidratos.    Consejos para seguir este plan  Leer las etiquetas de los alimentos  · Comience por controlar el tamaño de la porción en la etiqueta. La cantidad de calorías, carbohidratos, grasas y otros nutrientes mencionados en la etiqueta se basan en una porción del alimento. Muchos alimentos contienen más de una porción por envase.  · Verifique la cantidad total de gramos (g)   de carbohidratos totales en una porción. Puede calcular la cantidad de porciones de carbohidratos al dividir el total de carbohidratos por 15. Por ejemplo, si un alimento posee un total de 30 g de carbohidratos, equivale a 2 porciones de carbohidratos.  · Verifique la cantidad de gramos (g) de grasas saturadas y grasas trans  en una porción. Escoja alimentos que no contengan grasa o que tengan un bajo contenido.  · Controle la cantidad de miligramos (mg) de sodio en una porción. La mayoría de las personas deben limitar la ingesta de sodio total a menos de 2300 mg por día.  · Siempre consulte la información nutricional de los alimentos etiquetados como “con bajo contenido de grasa” o “sin grasa”. Estos alimentos pueden ser más altos en azúcar agregada o en carbohidratos refinados y deben evitarse.  · Hable con el nutricionista para identificar sus objetivos diarios en cuanto a los nutrientes mencionados en la etiqueta.  De compras  · Evite comprar alimentos procesados, enlatados o prehechos. Estos alimentos tienden a tener mayor cantidad de grasa, sodio y azúcar agregada.  · Compre en la zona exterior de la tienda de comestibles. Esta incluye frutas y vegetales frescos, granos a granel, carnes frescas y productos lácteos frescos.  Cocción  · Utilice métodos de cocción a baja temperatura, como hornear, en lugar de métodos de cocción a alta temperatura, como freír en abundante aceite.  · Cocine con aceites saludables, como el aceite de oliva, canola o girasol.  · Evite cocinar con manteca, crema o carnes con alto contenido de grasa.  Planificación de las comidas  · Consuma las comidas y las colaciones de forma regular, preferentemente a la misma hora todos los días. Evite pasar largos períodos de tiempo sin comer.  · Consuma alimentos ricos en fibra, como frutas frescas, verduras, frijoles y cereales integrales. Consulte al nutricionista sobre cuántas porciones de carbohidratos puede consumir en cada comida.  · Consuma entre 4 y 6 onzas de proteínas magras por día, como carnes magras, pollo, pescado, huevos o tofu. 1 onza equivale a 1 onza de carne, pollo o pescado, 1 huevo, o 1/4 taza de tofu.  · Coma algunos alimentos por día que contengan grasas saludables, como aguacates, frutos secos, semillas y pescado.  Estilo de vida     · Controle su nivel de glucemia con regularidad.  · Haga ejercicio al menos 30 minutos, 5 días o más por semana, o como se lo haya indicado el médico.  · Tome los medicamentos como se lo haya indicado el médico.  · No consuma ningún producto que contenga nicotina o tabaco, como cigarrillos y cigarrillos electrónicos. Si necesita ayuda para dejar de fumar, consulte al médico.  · Trabaje con un asesor o instructor en diabetes para identificar estrategias para controlar el estrés y cualquier desafío emocional y social.  ¿Cuáles son algunas de las preguntas que puedo hacerle a mi médico?  · ¿Es necesario que me reúna con un instructor en diabetes?  · ¿Es necesario que me reúna con un nutricionista?  · ¿A qué número puedo llamar si tengo preguntas?  · ¿Cuáles son los mejores momentos para controlar la glucemia?  Dónde encontrar más información:  · Asociación Americana de la Diabetes (American Diabetes Association): diabetes.org/food-and-fitness/food  · Academia de Nutrición y Dietética (Academy of Nutrition and Dietetics): www.eatright.org/resources/health/diseases-and-conditions/diabetes  · Instituto Nacional de la Diabetes y las Enfermedades Digestivas y Renales (National Institute of Diabetes and Digestive and Kidney Diseases) (Institutos Nacionales de Salud, NIH): www.niddk.nih.gov/health-information/diabetes/overview/diet-eating-physical-activity  Resumen  · Un plan de alimentación saludable   lo ayudará a controlar la glucemia y mantener un estilo de vida saludable.  · Trabajar con un especialista en dietas y nutrición (nutricionista) puede ayudarlo a elaborar el mejor plan de alimentación para usted.  · Tenga en cuenta que los carbohidratos y el alcohol tienen efectos inmediatos en sus niveles de glucemia. Es importante contar los carbohidratos y consumir alcohol con prudencia.  Esta información no tiene como fin reemplazar el consejo del médico. Asegúrese de hacerle al médico cualquier pregunta que tenga.   Document Released: 12/13/2007 Document Revised: 12/26/2016 Document Reviewed: 12/26/2016  Elsevier Interactive Patient Education © 2018 Elsevier Inc.

## 2018-10-15 ENCOUNTER — Other Ambulatory Visit: Payer: Self-pay | Admitting: Internal Medicine

## 2018-10-15 ENCOUNTER — Other Ambulatory Visit: Payer: Self-pay

## 2018-10-15 ENCOUNTER — Encounter (INDEPENDENT_AMBULATORY_CARE_PROVIDER_SITE_OTHER): Payer: Self-pay | Admitting: Internal Medicine

## 2018-10-15 ENCOUNTER — Ambulatory Visit (INDEPENDENT_AMBULATORY_CARE_PROVIDER_SITE_OTHER): Payer: Self-pay | Admitting: Internal Medicine

## 2018-10-15 VITALS — BP 145/78 | HR 89 | Temp 98.2°F | Ht 64.0 in | Wt 196.6 lb

## 2018-10-15 DIAGNOSIS — E669 Obesity, unspecified: Principal | ICD-10-CM

## 2018-10-15 DIAGNOSIS — E1169 Type 2 diabetes mellitus with other specified complication: Secondary | ICD-10-CM

## 2018-10-15 DIAGNOSIS — Z23 Encounter for immunization: Secondary | ICD-10-CM

## 2018-10-15 DIAGNOSIS — E1165 Type 2 diabetes mellitus with hyperglycemia: Secondary | ICD-10-CM

## 2018-10-15 DIAGNOSIS — I10 Essential (primary) hypertension: Secondary | ICD-10-CM

## 2018-10-15 DIAGNOSIS — Z6833 Body mass index (BMI) 33.0-33.9, adult: Secondary | ICD-10-CM

## 2018-10-15 DIAGNOSIS — L309 Dermatitis, unspecified: Secondary | ICD-10-CM

## 2018-10-15 LAB — POCT GLYCOSYLATED HEMOGLOBIN (HGB A1C): Hemoglobin A1C: 9.9 % — AB (ref 4.0–5.6)

## 2018-10-15 MED ORDER — TRUEPLUS LANCETS 28G MISC: 1 refills | Status: DC

## 2018-10-15 MED ORDER — GLUCOSE BLOOD VI STRP: ORAL_STRIP | 12 refills | Status: DC

## 2018-10-15 MED ORDER — HYDROCORTISONE 1 % EX CREA: TOPICAL_CREAM | CUTANEOUS | 0 refills | Status: DC

## 2018-10-15 MED ORDER — TRUE METRIX METER W/DEVICE KIT: PACK | 0 refills | Status: DC

## 2018-10-15 MED ORDER — GLIMEPIRIDE 2 MG PO TABS: 2.0000 mg | ORAL_TABLET | Freq: Two times a day (BID) | ORAL | 3 refills | Status: DC

## 2018-10-15 MED ORDER — LISINOPRIL 5 MG PO TABS: 5.0000 mg | ORAL_TABLET | Freq: Every day | ORAL | 3 refills | Status: DC

## 2018-10-15 NOTE — Progress Notes (Signed)
  Patient ID: Robyn Robyn [name], female    DOB: 10/28/1970  MRN: [id number]  CC: Follow-up (DM2)   Subjective: Robyn Robyn [name] is a 47 y.o. female who presents for chronic ds management Her concerns today include:  Pt with hx of DM and obesity   DIABETES TYPE 2 Last A1C:   Results for orders placed or performed in visit on 10/15/18  HgB A1c  Result Value Ref Range   Hemoglobin A1C 9.9 (A) 4.0 - 5.6 %   HbA1c POC (<> result, manual entry)     HbA1c, POC (prediabetic range)     HbA1c, POC (controlled diabetic range)      Med Adherence:  [x] Yes    [] No Medication side effects:  [] Yes    [x] No Home Monitoring?  [] Yes    [x] No, stripes were too expensive at [employer].  Would like to get glucometer from our pharmacy Home glucose results range: Diet Adherence: [] Yes    [x] No. Drinks juices  Exercise: [x] Yes, Zumba 3 x a wk    [] No Hypoglycemic episodes?: [] Yes    [] No Numbness of the feet? [] Yes    [x] No Retinopathy hx? [] Yes    [x] No Last eye exam:  Has problems seeing close up.  Last eye exam was yrs ago.  Comments:  C/o itchy rash on face x a few wks.  No initiating factors. No new body products.  No jt pains or swelling  BP noted to be elevated today and on last visit.    Patient Active Problem List   Diagnosis Date Noted  . Obesity 05/27/2015  . Diabetes mellitus type 2 in obese (HCC) 05/27/2015     Current Outpatient Medications on File Prior to Visit  Medication Sig Dispense Refill  . glimepiride (AMARYL) 2 MG tablet Take 1 tablet (2 mg total) by mouth daily before breakfast. 90 tablet 1  . metFORMIN (GLUCOPHAGE) 1000 MG tablet Take 1 tablet (1,000 mg total) by mouth 2 (two) times daily with a meal. 180 tablet 1  . Blood Glucose Monitoring Suppl (TRUE METRIX METER) W/DEVICE KIT Check sugars daily 1 kit 0  . glucose blood (TRUE METRIX BLOOD GLUCOSE TEST) test strip Use as instructed 100 each 12  . TRUEPLUS LANCETS 28G MISC Check sugars daily 100  each 1   No current facility-administered medications on file prior to visit.     No Known Allergies  Social History   Socioeconomic History  . Marital status: Single    Spouse name: Not on file  . Number of children: Not on file  . Years of education: Not on file  . Highest education level: Not on file  Occupational History  . Not on file  Social Needs  . Financial resource strain: Not on file  . Food insecurity:    Worry: Not on file    Inability: Not on file  . Transportation needs:    Medical: Not on file    Non-medical: Not on file  Tobacco Use  . Smoking status: Never Smoker  . Smokeless tobacco: Never Used  Substance and Sexual Activity  . Alcohol use: No    Alcohol/week: 0.0 standard drinks  . Drug use: No  . Sexual activity: Never  Lifestyle  . Physical activity:    Days per week: Not on file    Minutes per session: Not on file  . Stress: Not on file  Relationships  .   Social connections:    Talks on phone: Not on file    Gets together: Not on file    Attends religious service: Not on file    Active member of club or organization: Not on file    Attends meetings of clubs or organizations: Not on file    Relationship status: Not on file  . Intimate partner violence:    Fear of current or ex partner: Not on file    Emotionally abused: Not on file    Physically abused: Not on file    Forced sexual activity: Not on file  Other Topics Concern  . Not on file  Social History Narrative  . Not on file    No family history on file.  No past surgical history on file.  ROS: Review of Systems Neg except as above PHYSICAL EXAM: BP (!) 145/78 (BP Location: Left Arm, Patient Position: Sitting, Cuff Size: Normal)   Pulse 89   Temp 98.2 F (36.8 C) (Oral)   Ht 5' 4" (1.626 m)   Wt 196 lb 9.6 oz (89.2 kg)   LMP 10/01/2018 (Approximate)   SpO2 98%   BMI 33.75 kg/m   Repeat BP 150/90 Physical Exam  General appearance - alert, well appearing, and in no  distress Mental status - normal mood, behavior, speech, dress, motor activity, and thought processes Neck - supple, no significant adenopathy Chest - clear to auscultation, no wheezes, rales or rhonchi, symmetric air entry Heart - normal rate, regular rhythm, normal S1, S2, no murmurs, rubs, clicks or gallops Extremities - peripheral pulses normal, no pedal edema, no clubbing or cyanosis Skin - fine papular rash over cheeks   Results for orders placed or performed in visit on 10/15/18  HgB A1c  Result Value Ref Range   Hemoglobin A1C 9.9 (A) 4.0 - 5.6 %   HbA1c POC (<> result, manual entry)     HbA1c, POC (prediabetic range)     HbA1c, POC (controlled diabetic range)      ASSESSMENT AND PLAN: 1. Diabetes mellitus type 2 in obese (HCC) A1c improved but not at goal.  Recommend increase Amaryl to 2 mg twice a day.  Continue metformin.  Continue regular exercise.  Discussed healthy eating habits.  Advised to avoid sugary drinks like juices and sodas, cut Robyn [name] on white carbohydrates and eat more white meat than red meat - HgB A1c - Comprehensive metabolic panel; Future - Lipid panel; Future - CBC; Future - Microalbumin / creatinine urine ratio; Future - glimepiride (AMARYL) 2 MG tablet; Take 1 tablet (2 mg total) by mouth 2 (two) times daily.  Dispense: 180 tablet; Refill: 3 - Blood Glucose Monitoring Suppl (TRUE METRIX METER) w/Device KIT; Check sugars daily  Dispense: 1 kit; Refill: 0 - glucose blood (TRUE METRIX BLOOD GLUCOSE TEST) test strip; Use as instructed  Dispense: 100 each; Refill: 12 - TRUEPLUS LANCETS 28G MISC; Check sugars daily  Dispense: 100 each; Refill: 1  2. Essential hypertension New with elevation on 2 separate office visits.  DASH diet discussed and encouraged.  Start lisinopril 5 mg daily. - lisinopril (PRINIVIL,ZESTRIL) 5 MG tablet; Take 1 tablet (5 mg total) by mouth daily.  Dispense: 90 tablet; Refill: 3  3. Dermatitis - hydrocortisone cream 1 %; Apply BID to  affected area x 5 days  Dispense: 30 g; Refill: 0 Patient to try changing out her soap  4. Need for vaccination against Streptococcus pneumoniae - Pneumococcal polysaccharide vaccine 23-valent greater than or equal to 2yo   subcutaneous/IM  5. Need for influenza vaccination Given.     Patient was given the opportunity to ask questions.  Patient verbalized understanding of the plan and was able to repeat key elements of the plan.  [employer] interpreter used during this encounter. #[id number]   Orders Placed This Encounter  Procedures  . HgB A1c     Requested Prescriptions    No prescriptions requested or ordered in this encounter    No follow-ups on file.  Deborah [name], MD, FACP 

## 2018-10-16 MED FILL — TRUEplus LANCETS 28G MISC: 30 days supply | Qty: 100 | Fill #0

## 2018-10-16 MED FILL — TRUE METRIX TEST STRIP: 30 days supply | Qty: 100 | Fill #0

## 2018-10-16 MED FILL — !TRUE METRIX BLOOD GLUCOSE: 30 days supply | Qty: 1 | Fill #0

## 2018-11-06 MED FILL — TRUEplus LANCETS 28G MISC: 30 days supply | Qty: 100 | Fill #0

## 2018-11-06 MED FILL — TRUE METRIX TEST STRIP: 30 days supply | Qty: 100 | Fill #0

## 2018-11-06 MED FILL — !TRUE METRIX BLOOD GLUCOSE: 30 days supply | Qty: 1 | Fill #0

## 2018-12-10 ENCOUNTER — Ambulatory Visit (INDEPENDENT_AMBULATORY_CARE_PROVIDER_SITE_OTHER): Payer: Self-pay | Admitting: Primary Care

## 2019-01-15 ENCOUNTER — Ambulatory Visit (INDEPENDENT_AMBULATORY_CARE_PROVIDER_SITE_OTHER): Payer: Self-pay | Admitting: Primary Care

## 2019-01-15 ENCOUNTER — Ambulatory Visit: Payer: Self-pay | Admitting: Primary Care

## 2019-02-13 ENCOUNTER — Ambulatory Visit: Payer: Self-pay | Admitting: Primary Care

## 2020-08-31 ENCOUNTER — Emergency Department (HOSPITAL_COMMUNITY)
Admission: EM | Admit: 2020-08-31 | Discharge: 2020-08-31 | Disposition: A | Discharge status: Home / Self Care | Payer: Self-pay | Attending: Emergency Medicine | Admitting: Emergency Medicine

## 2020-08-31 ENCOUNTER — Other Ambulatory Visit: Payer: Self-pay

## 2020-08-31 DIAGNOSIS — L03115 Cellulitis of right lower limb: Secondary | ICD-10-CM | POA: Insufficient documentation

## 2020-08-31 DIAGNOSIS — E119 Type 2 diabetes mellitus without complications: Secondary | ICD-10-CM | POA: Insufficient documentation

## 2020-08-31 DIAGNOSIS — Z7984 Long term (current) use of oral hypoglycemic drugs: Secondary | ICD-10-CM | POA: Insufficient documentation

## 2020-08-31 MED ORDER — SULFAMETHOXAZOLE-TRIMETHOPRIM 800-160 MG PO TABS: 1.0000 | ORAL_TABLET | Freq: Two times a day (BID) | ORAL | 0 refills | Status: AC

## 2020-08-31 NOTE — Discharge Instructions (Addendum)
Apply warm compress to the affected area at least twice a day, take the antibiotics, return to provider to seek care if the redness and pain is worsening.

## 2020-08-31 NOTE — ED Provider Notes (Signed)
[facility] EMERGENCY DEPARTMENT Provider Note   CSN: [id number] Arrival date & time: 08/31/20  7001     History Chief Complaint  Patient presents with  . Foot Pain    Robyn [name] is a 49 y.o. female.   Foot Pain This is a new problem. The current episode started yesterday. The problem occurs constantly. The problem has not changed since onset.Pertinent negatives include no chest pain, no headaches and no shortness of breath. Nothing aggravates the symptoms. Nothing relieves the symptoms. She has tried nothing for the symptoms. The treatment provided mild relief.       No past medical history on file.  Patient Active Problem List   Diagnosis Date Noted  . Obesity 05/27/2015  . Diabetes mellitus type 2 in obese ([place]) 05/27/2015    No past surgical history on file.   OB History   No obstetric history on file.     No family history on file.  Social History   Tobacco Use  . Smoking status: Never Smoker  . Smokeless tobacco: Never Used  Substance Use Topics  . Alcohol use: No    Alcohol/week: 0.0 standard drinks  . Drug use: No    Home Medications Prior to Admission medications   Medication Sig Start Date End Date Taking? Authorizing Provider  Blood Glucose Monitoring Suppl (TRUE METRIX METER) w/Device KIT Check sugars daily 10/15/18   Ladell Pier, MD  glimepiride (AMARYL) 2 MG tablet Take 1 tablet (2 mg total) by mouth 2 (two) times daily. 10/15/18   Ladell Pier, MD  glucose blood (TRUE METRIX BLOOD GLUCOSE TEST) test strip Use as instructed 10/15/18   Ladell Pier, MD  hydrocortisone cream 1 % Apply BID to affected area x 5 days 10/15/18   Ladell Pier, MD  lisinopril (PRINIVIL,ZESTRIL) 5 MG tablet Take 1 tablet (5 mg total) by mouth daily. 10/15/18   Ladell Pier, MD  metFORMIN (GLUCOPHAGE) 1000 MG tablet Take 1 tablet (1,000 mg total) by mouth 2 (two) times daily with a meal. 07/16/18   Clent Demark, PA-C  sulfamethoxazole-trimethoprim (BACTRIM DS) 800-160 MG tablet Take 1 tablet by mouth 2 (two) times daily for 5 days. 08/31/20 09/05/20  Breck Coons, MD  TRUEPLUS LANCETS 28G MISC Check sugars daily 10/15/18   Ladell Pier, MD    Allergies    Patient has no known allergies.  Review of Systems   Review of Systems  Constitutional: Negative for chills and fever.  HENT: Negative for congestion and rhinorrhea.   Respiratory: Negative for cough and shortness of breath.   Cardiovascular: Negative for chest pain and palpitations.  Gastrointestinal: Negative for diarrhea, nausea and vomiting.  Genitourinary: Negative for difficulty urinating and dysuria.  Musculoskeletal: Positive for arthralgias. Negative for back pain.  Skin: Positive for color change. Negative for rash and wound.  Neurological: Negative for light-headedness and headaches.    Physical Exam Updated Vital Signs BP (!) 177/73 (BP Location: Right Arm)   Pulse (!) 101   Temp (!) 97.5 F (36.4 C) (Oral)   Resp 20   SpO2 100%   Physical Exam Vitals and nursing note reviewed. Exam conducted with a chaperone present.  Constitutional:      General: She is not in acute distress.    Appearance: Normal appearance.  HENT:     Head: Normocephalic and atraumatic.     Nose: No rhinorrhea.  Eyes:     General:  Right eye: No discharge.        Left eye: No discharge.     Conjunctiva/sclera: Conjunctivae normal.  Cardiovascular:     Rate and Rhythm: Normal rate and regular rhythm.  Pulmonary:     Effort: Pulmonary effort is normal. No respiratory distress.     Breath sounds: No stridor.  Abdominal:     General: Abdomen is flat. There is no distension.     Palpations: Abdomen is soft.  Musculoskeletal:        General: Swelling and tenderness present. No signs of injury.     Right lower leg: No edema.     Left lower leg: No edema.     Comments: Mid dorsum of the right foot small area of erythema  induration, no fluctuance normal range of motion intact pulses, mild excoriated area at the center of the erythema.  Skin:    General: Skin is warm and dry.  Neurological:     General: No focal deficit present.     Mental Status: She is alert. Mental status is at baseline.     Motor: No weakness.  Psychiatric:        Mood and Affect: Mood normal.        Behavior: Behavior normal.     ED Results / Procedures / Treatments   Labs (all labs ordered are listed, but only abnormal results are displayed) Labs Reviewed - No data to display  EKG None  Radiology No results found.  Procedures Procedures (including critical care time)  Medications Ordered in ED Medications - No data to display  ED Course  I have reviewed the triage vital signs and the nursing notes.  Pertinent labs & imaging results that were available during my care of the patient were reviewed by me and considered in my medical decision making (see chart for details).    MDM Rules/Calculators/A&P                          Patient with history of diabetes has concerns for mild cellulitic changes of the right lower extremity.  Antibiotics given, patient overall well-appearing tolerating p.o. no fevers.  Patient agrees to plan warm compress antibiotics and outpatient follow-up. Final Clinical Impression(s) / ED Diagnoses Final diagnoses:  Cellulitis of right foot    Rx / DC Orders ED Discharge Orders         Ordered    sulfamethoxazole-trimethoprim (BACTRIM DS) 800-160 MG tablet  2 times daily        08/31/20 0907           Breck Coons, MD 08/31/20 [phone]

## 2020-08-31 NOTE — ED Triage Notes (Signed)
Pt with dark painful area to L dorsal foot, noticed Saturday morning at 0100. Denies injury.

## 2020-08-31 NOTE — ED Notes (Signed)
Dr. Katz at bedside.  

## 2022-07-28 ENCOUNTER — Ambulatory Visit: Payer: Self-pay

## 2022-07-28 ENCOUNTER — Other Ambulatory Visit (HOSPITAL_COMMUNITY): Payer: Self-pay

## 2022-07-28 VITALS — BP 153/70 | HR 96 | Temp 98.1°F | Ht 64.0 in | Wt 180.4 lb

## 2022-07-28 DIAGNOSIS — E669 Obesity, unspecified: Secondary | ICD-10-CM

## 2022-07-28 DIAGNOSIS — E1165 Type 2 diabetes mellitus with hyperglycemia: Secondary | ICD-10-CM

## 2022-07-28 DIAGNOSIS — E1159 Type 2 diabetes mellitus with other circulatory complications: Secondary | ICD-10-CM

## 2022-07-28 DIAGNOSIS — I159 Secondary hypertension, unspecified: Secondary | ICD-10-CM

## 2022-07-28 DIAGNOSIS — E1169 Type 2 diabetes mellitus with other specified complication: Secondary | ICD-10-CM

## 2022-07-28 DIAGNOSIS — E785 Hyperlipidemia, unspecified: Secondary | ICD-10-CM

## 2022-07-28 DIAGNOSIS — E1142 Type 2 diabetes mellitus with diabetic polyneuropathy: Secondary | ICD-10-CM

## 2022-07-28 DIAGNOSIS — Z7984 Long term (current) use of oral hypoglycemic drugs: Secondary | ICD-10-CM

## 2022-07-28 DIAGNOSIS — Z0000 Encounter for general adult medical examination without abnormal findings: Secondary | ICD-10-CM

## 2022-07-28 LAB — GLUCOSE, CAPILLARY: Glucose-Capillary: 235 mg/dL — ABNORMAL HIGH (ref 70–99)

## 2022-07-28 LAB — POCT GLYCOSYLATED HEMOGLOBIN (HGB A1C): Hemoglobin A1C: 13.7 % — AB (ref 4.0–5.6)

## 2022-07-28 MED ORDER — METFORMIN HCL ER 500 MG PO TB24
500.0000 mg | ORAL_TABLET | Freq: Four times a day (QID) | ORAL | 0 refills | Status: DC
  Filled 2022-07-28 – 2022-07-29 (×2): qty 70, 18d supply, fill #0

## 2022-07-28 MED ORDER — IRBESARTAN 150 MG PO TABS
150.0000 mg | ORAL_TABLET | Freq: Every day | ORAL | 0 refills | Status: DC
  Filled 2022-07-28: qty 30, 30d supply, fill #0

## 2022-07-28 NOTE — Progress Notes (Deleted)
T2DM Dating back to 2016 Last seen by healthcare provider in ED A1c 9.9 09/2018  HCM Mammo Optho, foot, microalbumin Pap Colonoscopy Flu HCV/HIV  151/69

## 2022-07-28 NOTE — Patient Instructions (Addendum)
Robyn [name]. Robyn [name] por permitirnos brindarle su atencin hoy. Hoy discutimos:  Diabetes: tome metformina como se indica a continuacin: Un comprimido de 500 mg con la cena durante 7 [place], luego un comprimido de 500 mg con el desayuno y un comprimido de 500 mg con la cena (2 en total) durante 7 das, [employer] un comprimido de 500 mg con el desayuno y [place] comprimidos de 500 mg con la cena (3 comprimidos al Futures trader) durante 7 das, [employer] comprimidos de 500 mg con el desayuno y [place] comprimidos de 500 mg con la cena (4 comprimidos en total) [employer]  Presin arterial alta: le hemos comenzado a tomar un nuevo medicamento llamado irbesartn que debe tomar una vez al da.  He ordenado los siguientes laboratorios para usted:   rdenes de laboratorio      Glucosa capilar      Perfil lipdico      BMP8+brecha aninica      CBC sin diferencia      POC Hbg A1C  He ordenado/cambiado los siguientes medicamentos:  Suspenda los siguientes medicamentos: No hay medicamentos discontinuados.  Inicie los siguientes medicamentos: Los mdicos ordenaron este encuentro Medicamentos  tableta de irbesartn (AVAPRO) de 150 mg Sig: Tome 1 tableta (150 mg en total) por va oral al da. Dispensacin: 30 comprimidos Recarga: 0 PROGRAMA IM  metFORMINA (GLUCFAGO-XR) 500 MG tableta de 24 horas Sig: Tome 1 tableta (500 mg en total) por va oral 4 (cuatro) veces al da. Dispensacin: 70 comprimidos Recarga: 0 PROGRAMA IM    Seguimiento: 1 mes    Esperamos verte la prxima vez. Llame a nuestra clnica al [phone] si tiene alguna pregunta o inquietud. El mejor momento para llamar es de lunes a viernes de 9 a. m. a 4 p. m., pero hay alguien disponible las 24 horas, los 7 [address]  [address] de la [place]. Si es fuera del horario de atencin o durante el fin de [place], llame al nmero principal del hospital y pregunte por el residente de guardia de medicina interna. Si necesita reabastecimiento de  medicamentos, notifique a su farmacia con una semana de anticipacin y ellos nos enviarn una solicitud.  Gracias por confiarme tu cuidado. Deseandote lo mejor!  Willette Cluster, MD [facility]  Thank you, Robyn [name] for allowing [place] to provide your care today. Today we discussed :  Diabetes- Please take metformin as directed below: A 500mg  tablet with dinner for 7 days, then a 500 mg tablet with breakfast and a 500 mg tablet with dinner (2 total) for 7 days, then a 500mg  tablet with breakfast and two 500 mg tablets with dinner (3 tablets daily) for 7 days, then two 500mg  tablets with breakfast and two 500 mg tablets with dinner (4 tablets total) everyday  High blood pressure- We have started you on a new medication called irbesartan to be taken once per day.  I have ordered the following labs for you:   Lab Orders         Glucose, capillary         Lipid Profile         BMP8+Anion Gap         CBC no Diff         POC Hbg A1C      I have ordered the following medication/changed the following medications:   Stop the following medications: There are no discontinued medications.   Start the following medications: Meds ordered this encounter  Medications   irbesartan (AVAPRO) 150 MG tablet    Sig: Take 1 tablet (150 mg total) by mouth daily.    Dispense:  30 tablet    Refill:  0    IM PROGRAM   metFORMIN (GLUCOPHAGE-XR) 500 MG 24 hr tablet    Sig: Take 1 tablet (500 mg total) by mouth 4 (four) times daily.    Dispense:  70 tablet    Refill:  0    IM PROGRAM     Follow up:  1 month      We look forward to seeing you next time. Please call our clinic at [phone] if you have any questions or concerns. The best time to call is Monday-Friday from 9am-4pm, but there is someone available 24/7. If after hours or the weekend, call the main hospital number and ask for the Internal Medicine Resident On-Call. If you need medication refills,  please notify your pharmacy one week in advance and they will send [place] a request.   Thank you for trusting me with your care. Wishing you the best!   Willette Cluster ,MD [facility]

## 2022-07-29 ENCOUNTER — Other Ambulatory Visit (HOSPITAL_COMMUNITY): Payer: Self-pay

## 2022-07-29 DIAGNOSIS — E1169 Type 2 diabetes mellitus with other specified complication: Secondary | ICD-10-CM | POA: Insufficient documentation

## 2022-07-29 DIAGNOSIS — Z0000 Encounter for general adult medical examination without abnormal findings: Secondary | ICD-10-CM | POA: Insufficient documentation

## 2022-07-29 DIAGNOSIS — I10 Essential (primary) hypertension: Secondary | ICD-10-CM | POA: Insufficient documentation

## 2022-07-29 LAB — CBC
Hematocrit: 41.1 % (ref 34.0–46.6)
Hemoglobin: 13.6 g/dL (ref 11.1–15.9)
MCH: 31.3 pg (ref 26.6–33.0)
MCHC: 33.1 g/dL (ref 31.5–35.7)
MCV: 95 fL (ref 79–97)
Platelets: 193 10*3/uL (ref 150–450)
RBC: 4.34 x10E6/uL (ref 3.77–5.28)
RDW: 11.8 % (ref 11.7–15.4)
WBC: 9.3 10*3/uL (ref 3.4–10.8)

## 2022-07-29 LAB — BMP8+ANION GAP
Anion Gap: 18 mmol/L (ref 10.0–18.0)
BUN/Creatinine Ratio: 14 (ref 9–23)
BUN: 8 mg/dL (ref 6–24)
CO2: 22 mmol/L (ref 20–29)
Calcium: 9.8 mg/dL (ref 8.7–10.2)
Chloride: 96 mmol/L (ref 96–106)
Creatinine, Ser: 0.59 mg/dL (ref 0.57–1.00)
Glucose: 246 mg/dL — ABNORMAL HIGH (ref 70–99)
Potassium: 4.1 mmol/L (ref 3.5–5.2)
Sodium: 136 mmol/L (ref 134–144)
eGFR: 109 mL/min/{1.73_m2} (ref >59)

## 2022-07-29 LAB — LIPID PANEL
Chol/HDL Ratio: 4.9 ratio — ABNORMAL HIGH (ref 0.0–4.4)
Cholesterol, Total: 220 mg/dL — ABNORMAL HIGH (ref 100–199)
HDL: 45 mg/dL (ref >39)
LDL Chol Calc (NIH): 144 mg/dL — ABNORMAL HIGH (ref 0–99)
Triglycerides: 173 mg/dL — ABNORMAL HIGH (ref 0–149)
VLDL Cholesterol Cal: 31 mg/dL (ref 5–40)

## 2022-07-29 NOTE — Assessment & Plan Note (Signed)
Given pink [place] cancer screening pamphlet and number in spanish to schedule mammogram and pap smear.

## 2022-07-29 NOTE — Progress Notes (Signed)
New Patient Office Visit  Subjective    Patient ID: TRACIE DORE, female    DOB: May 23, 1971  Age: 51 y.o. MRN: [id number]  CC:  Chief Complaint  Patient presents with   Diabetes    HPI Robyn [name] is a 51 y/o female with a pmh outlined below presents to establish care. Please see encounter tab for HPI and A/P information.  Outpatient Encounter Medications as of 07/28/2022  Medication Sig   irbesartan (AVAPRO) 150 MG tablet Take 1 tablet (150 mg total) by mouth daily.   metFORMIN (GLUCOPHAGE-XR) 500 MG 24 hr tablet Take 1  tablet with dinner for 7 days, then 1 tab with breakfast and 1 tab with dinner  for 7 days, then  1 tab with breakfast and 2 tab with dinner  for 7 days, then 2 tab with breakfast and 2 tab with dinner everyday   Blood Glucose Monitoring Suppl (TRUE METRIX METER) w/Device KIT Check sugars daily   glimepiride (AMARYL) 2 MG tablet Take 1 tablet (2 mg total) by mouth 2 (two) times daily.   glucose blood (TRUE METRIX BLOOD GLUCOSE TEST) test strip Use as instructed   hydrocortisone cream 1 % Apply BID to affected area x 5 days   lisinopril (PRINIVIL,ZESTRIL) 5 MG tablet Take 1 tablet (5 mg total) by mouth daily.   metFORMIN (GLUCOPHAGE) 1000 MG tablet Take 1 tablet (1,000 mg total) by mouth 2 (two) times daily with a meal.   TRUEPLUS LANCETS 28G MISC Check sugars daily   No facility-administered encounter medications on file as of 07/28/2022.    No past medical history on file.  No past surgical history on file.  No family history on file.  Social History   Socioeconomic History   Marital status: Single    Spouse name: Not on file   Number of children: Not on file   Years of education: Not on file   Highest education level: Not on file  Occupational History   Not on file  Tobacco Use   Smoking status: Never   Smokeless tobacco: Never  Substance and Sexual Activity   Alcohol use: No    Alcohol/week: 0.0 standard drinks of alcohol    Drug use: No   Sexual activity: Never  Other Topics Concern   Not on file  Social History Narrative   Not on file   Social Determinants of Health   Financial Resource Strain: Not on file  Food Insecurity: Not on file  Transportation Needs: Not on file  Physical Activity: Not on file  Stress: Not on file  Social Connections: Not on file  Intimate Partner Violence: Not on file    Review of Systems  All other systems reviewed and are negative.       Objective    BP (!) 153/70 (BP Location: Left Arm, Cuff Size: Large)   Pulse 96   Temp 98.1 F (36.7 C) (Oral)   Ht _0  (1.626 m)   Wt 180 lb 6.4 oz (81.8 kg)   SpO2 100%   BMI 30.97 kg/m   Physical Exam Constitutional:      General: She is not in acute distress.    Appearance: Normal appearance. She is obese.  Eyes:     General: No scleral icterus.    Conjunctiva/sclera: Conjunctivae normal.  Cardiovascular:     Rate and Rhythm: Normal rate and regular rhythm.     Pulses: Normal pulses.     Heart sounds: Normal heart sounds.  No murmur heard.    No friction rub. No gallop.  Pulmonary:     Effort: Pulmonary effort is normal. No respiratory distress.     Breath sounds: Normal breath sounds. No wheezing or rales.  Musculoskeletal:     Right lower leg: No edema.     Left lower leg: No edema.  Skin:    General: Skin is warm and dry.     Capillary Refill: Capillary refill takes less than 2 seconds.  Neurological:     Mental Status: She is alert.         Assessment & Plan:   Problem List Items Addressed This Visit       Cardiovascular and Mediastinum   Hypertension    BP in clinic found to be 151/69 and 153/70 on repeat. Her blood pressure has also been historically high on chart review. She denies CP,SOB, peripheral edema. Given long half life and renal protective nature will start irbasartan today. May need additional agents at next visit. BMP today with normal renal function and electrolytes, will check  at follow up in 1 month. -irbasartan 143m daily      Relevant Medications   irbesartan (AVAPRO) 150 MG tablet   Other Relevant Orders   BMP8+Anion Gap (Completed)   CBC no Diff (Completed)     Endocrine   Poorly controlled type 2 diabetes mellitus with peripheral neuropathy (HCC) - Primary   Relevant Medications   irbesartan (AVAPRO) 150 MG tablet   metFORMIN (GLUCOPHAGE-XR) 500 MG 24 hr tablet   Other Relevant Orders   Lipid Profile (Completed)   BMP8+Anion Gap (Completed)   CBC no Diff (Completed)   Hyperlipidemia associated with type 2 diabetes mellitus ([place]    LDL found to be 144 today. Given severely uncontrolled T2DM goal LDL should be at least <100. Will need to start high intensity Rosuvastatin at next visit.      Relevant Medications   irbesartan (AVAPRO) 150 MG tablet   metFORMIN (GLUCOPHAGE-XR) 500 MG 24 hr tablet     Other   Healthcare maintenance    Given pink  cancer screening pamphlet and number in spanish to schedule mammogram and pap smear.       Return in about 4 weeks (around 08/25/2022).   TIona Coach MD

## 2022-07-29 NOTE — Assessment & Plan Note (Signed)
LDL found to be 144 today. Given severely uncontrolled T2DM goal LDL should be at least <100. Will need to start high intensity Rosuvastatin at next visit.

## 2022-07-29 NOTE — Assessment & Plan Note (Addendum)
BP in clinic found to be 151/69 and 153/70 on repeat. Her blood pressure has also been historically high on chart review. She denies CP,SOB, peripheral edema. Given long half life and renal protective nature will start irbasartan today. May need additional agents at next visit. BMP today with normal renal function and electrolytes, will check at follow up in 1 month. -irbasartan 150mg  daily

## 2022-08-01 ENCOUNTER — Other Ambulatory Visit (HOSPITAL_COMMUNITY): Payer: Self-pay

## 2022-08-01 MED ORDER — ROSUVASTATIN CALCIUM 20 MG PO TABS
20.0000 mg | ORAL_TABLET | Freq: Every day | ORAL | 11 refills | Status: DC
  Filled 2022-08-01 – 2022-08-16 (×2): qty 30, 30d supply, fill #0

## 2022-08-01 NOTE — Progress Notes (Signed)
Called patient and discussed elevated cholesterol. Given uncontrolled longstanding diabetes with A1c to 13.7 and uncontrolled hypertension would like to start statin despite ASCVD 10 year risk of 5.3%. Will aim for goal of <100 and start rosuvastatin 20mg . Otherwise blood counts, kidney function and electrolytes normal. Messaged front desk for 1 month BP follow up.

## 2022-08-01 NOTE — Addendum Note (Signed)
Addended by: Willette Cluster on: 08/01/2022 01:20 PM   Modules accepted: Orders

## 2022-08-08 NOTE — Progress Notes (Signed)
Internal Medicine Clinic Attending  I saw and evaluated the patient.  I personally confirmed the key portions of the history and exam documented by the resident  and I reviewed pertinent patient test results.  The assessment, diagnosis, and plan were formulated together and I agree with the documentation in the resident's note.  

## 2022-08-09 ENCOUNTER — Other Ambulatory Visit (HOSPITAL_COMMUNITY): Payer: Self-pay

## 2022-08-16 ENCOUNTER — Other Ambulatory Visit (HOSPITAL_COMMUNITY): Payer: Self-pay

## 2022-08-17 ENCOUNTER — Other Ambulatory Visit (HOSPITAL_COMMUNITY): Payer: Self-pay

## 2022-08-29 ENCOUNTER — Encounter: Payer: Self-pay | Admitting: Student

## 2022-08-29 NOTE — Patient Instructions (Incomplete)
Patient returning for HTN follow up after SBP trending in 150s in clinic. Patient was started on Irbesartan 150 mg daily. Medication side effects -BMP today  Lipids LDL 144 T cholesterol HDL ASCVD score:  Needs to start on Rosuvastatin today  DM A1c was 13.7 at previous visit On metformin GLP 1 today ---

## 2022-08-31 ENCOUNTER — Emergency Department (HOSPITAL_COMMUNITY): Payer: No Typology Code available for payment source

## 2022-08-31 ENCOUNTER — Emergency Department (HOSPITAL_COMMUNITY)
Admission: EM | Admit: 2022-08-31 | Discharge: 2022-08-31 | Disposition: A | Discharge status: Home / Self Care | Payer: No Typology Code available for payment source | Attending: Emergency Medicine | Admitting: Emergency Medicine

## 2022-08-31 ENCOUNTER — Other Ambulatory Visit: Payer: Self-pay

## 2022-08-31 ENCOUNTER — Other Ambulatory Visit (HOSPITAL_COMMUNITY): Payer: Self-pay

## 2022-08-31 ENCOUNTER — Encounter (HOSPITAL_COMMUNITY): Payer: Self-pay

## 2022-08-31 DIAGNOSIS — Y92410 Unspecified street and highway as the place of occurrence of the external cause: Secondary | ICD-10-CM | POA: Insufficient documentation

## 2022-08-31 DIAGNOSIS — R739 Hyperglycemia, unspecified: Secondary | ICD-10-CM | POA: Insufficient documentation

## 2022-08-31 DIAGNOSIS — M545 Low back pain, unspecified: Secondary | ICD-10-CM | POA: Diagnosis not present

## 2022-08-31 DIAGNOSIS — R519 Headache, unspecified: Secondary | ICD-10-CM | POA: Diagnosis not present

## 2022-08-31 DIAGNOSIS — R079 Chest pain, unspecified: Secondary | ICD-10-CM | POA: Insufficient documentation

## 2022-08-31 DIAGNOSIS — M542 Cervicalgia: Secondary | ICD-10-CM | POA: Diagnosis present

## 2022-08-31 DIAGNOSIS — T07XXXA Unspecified multiple injuries, initial encounter: Secondary | ICD-10-CM

## 2022-08-31 HISTORY — DX: Essential (primary) hypertension: I10

## 2022-08-31 HISTORY — DX: Type 2 diabetes mellitus without complications: E11.9

## 2022-08-31 LAB — BASIC METABOLIC PANEL
Anion gap: 10 (ref 5–15)
BUN: 14 mg/dL (ref 6–20)
CO2: 24 mmol/L (ref 22–32)
Calcium: 9.4 mg/dL (ref 8.9–10.3)
Chloride: 102 mmol/L (ref 98–111)
Creatinine, Ser: 0.56 mg/dL (ref 0.44–1.00)
GFR, Estimated: >60 mL/min (ref >60)
Glucose, Bld: 318 mg/dL — ABNORMAL HIGH (ref 70–99)
Potassium: 3.7 mmol/L (ref 3.5–5.1)
Sodium: 136 mmol/L (ref 135–145)

## 2022-08-31 LAB — CBC WITH DIFFERENTIAL/PLATELET
Abs Immature Granulocytes: 0.04 10*3/uL (ref 0.00–0.07)
Basophils Absolute: 0.1 10*3/uL (ref 0.0–0.1)
Basophils Relative: 1 %
Eosinophils Absolute: 0.1 10*3/uL (ref 0.0–0.5)
Eosinophils Relative: 1 %
HCT: 39.8 % (ref 36.0–46.0)
Hemoglobin: 13.5 g/dL (ref 12.0–15.0)
Immature Granulocytes: 0 %
Lymphocytes Relative: 24 %
Lymphs Abs: 2.7 10*3/uL (ref 0.7–4.0)
MCH: 32.1 pg (ref 26.0–34.0)
MCHC: 33.9 g/dL (ref 30.0–36.0)
MCV: 94.8 fL (ref 80.0–100.0)
Monocytes Absolute: 0.5 10*3/uL (ref 0.1–1.0)
Monocytes Relative: 5 %
Neutro Abs: 8 10*3/uL — ABNORMAL HIGH (ref 1.7–7.7)
Neutrophils Relative %: 69 %
Platelets: 214 10*3/uL (ref 150–400)
RBC: 4.2 MIL/uL (ref 3.87–5.11)
RDW: 11.6 % (ref 11.5–15.5)
WBC: 11.4 10*3/uL — ABNORMAL HIGH (ref 4.0–10.5)
nRBC: 0 % (ref 0.0–0.2)

## 2022-08-31 LAB — TROPONIN I (HIGH SENSITIVITY)
Troponin I (High Sensitivity): 3 ng/L (ref <18)
Troponin I (High Sensitivity): 5 ng/L (ref <18)

## 2022-08-31 MED ORDER — NAPROXEN 500 MG PO TABS: 500.0000 mg | ORAL_TABLET | Freq: Two times a day (BID) | ORAL | 0 refills | Status: AC | PRN

## 2022-08-31 MED ORDER — HYDROCODONE-ACETAMINOPHEN 5-325 MG PO TABS
1.0000 | ORAL_TABLET | Freq: Once | ORAL | Status: AC
  Administered 2022-08-31: 1 via ORAL
  Filled 2022-08-31: qty 1
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
## 2022-08-31 NOTE — ED Notes (Signed)
Pt ambulatory to restroom without staff assist.
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
## 2022-11-02 ENCOUNTER — Ambulatory Visit: Payer: Self-pay

## 2022-11-02 ENCOUNTER — Other Ambulatory Visit (HOSPITAL_COMMUNITY): Payer: Self-pay

## 2022-11-02 ENCOUNTER — Other Ambulatory Visit: Payer: Self-pay

## 2022-11-02 VITALS — BP 126/62 | HR 79 | Temp 98.2°F | Ht 62.0 in | Wt 183.2 lb

## 2022-11-02 DIAGNOSIS — G5702 Lesion of sciatic nerve, left lower limb: Secondary | ICD-10-CM

## 2022-11-02 DIAGNOSIS — E1165 Type 2 diabetes mellitus with hyperglycemia: Secondary | ICD-10-CM

## 2022-11-02 DIAGNOSIS — Z7984 Long term (current) use of oral hypoglycemic drugs: Secondary | ICD-10-CM

## 2022-11-02 DIAGNOSIS — E785 Hyperlipidemia, unspecified: Secondary | ICD-10-CM

## 2022-11-02 DIAGNOSIS — E1142 Type 2 diabetes mellitus with diabetic polyneuropathy: Secondary | ICD-10-CM

## 2022-11-02 DIAGNOSIS — E1169 Type 2 diabetes mellitus with other specified complication: Secondary | ICD-10-CM

## 2022-11-02 DIAGNOSIS — E669 Obesity, unspecified: Secondary | ICD-10-CM

## 2022-11-02 DIAGNOSIS — I10 Essential (primary) hypertension: Secondary | ICD-10-CM

## 2022-11-02 LAB — POCT GLYCOSYLATED HEMOGLOBIN (HGB A1C): Hemoglobin A1C: 10.9 % — AB (ref 4.0–5.6)

## 2022-11-02 LAB — GLUCOSE, CAPILLARY: Glucose-Capillary: 235 mg/dL — ABNORMAL HIGH (ref 70–99)

## 2022-11-02 MED ORDER — METFORMIN HCL ER 500 MG PO TB24
500.0000 mg | ORAL_TABLET | Freq: Four times a day (QID) | ORAL | 0 refills | Status: DC
  Filled 2022-11-02: qty 70, 18d supply, fill #0

## 2022-11-02 MED ORDER — ROSUVASTATIN CALCIUM 20 MG PO TABS
20.0000 mg | ORAL_TABLET | Freq: Every day | ORAL | 11 refills | Status: DC
  Filled 2022-11-02: qty 30, 30d supply, fill #0

## 2022-11-02 MED ORDER — EMPAGLIFLOZIN 10 MG PO TABS
10.0000 mg | ORAL_TABLET | Freq: Every day | ORAL | 1 refills | Status: DC
  Filled 2022-11-02: qty 30, 30d supply, fill #0

## 2022-11-02 NOTE — Progress Notes (Unsigned)
HTN BP in clinic found to be 151/69 and 153/70 on repeat. Her blood pressure has also been historically high on chart review. She denies CP,SOB, peripheral edema. Given long half life and renal protective nature will start irbasartan today. May need additional agents at next visit. BMP today with normal renal function and electrolytes, will check at follow up in 1 month. -irbasartan 168m daily  T2DM Patient appears to have T2DM diagnosis dating back to 2016 per chart review. This ahas always been poorly controlled. She endorses the most recent time seeing a doctor or taking medications was 2 years ago. A1c was 13.7 today and POC glucose was 235. The patient is obese, denies weight loss, increased thirst or urination and has not been hospitalized so does not appear to be ketosis prone. She denies claudication, foot sores or ulcers, hand/foot burning or tingling, but does endorse numbness of the big toes. Her foot exam was notable for loss of sensation to monofilament at multiple locations bilaterally. The patient denies vision changes but is have signs of nerve damage based on peripheral neuropathy. Started on metformin today. Her BMP demonstrated normal renal function although this does not exlude microalbuminuria.She will need a multi-medication regimen but did not want to start her on a GLP-1 due to simultaneous GI side effects with metformin and do not want to induce yeast infection or UTI with SGLT-2 at this time given such high A1c. Will see patient back in 1 month and will start GLP-1 at that time. If no improvement at next A1c will need to consider insulin. -metformin 5068mdaily x7 days, 50056mIDx7 days, 1000m57m500mg106mly x 7 days, 1000mg 22mas tolerated. -will get eye exam when she gets approved for orange card  HLD LDL found to be 144 today. Given severely uncontrolled T2DM goal LDL should be at least <100. Will need to start high intensity Rosuvastatin at next visit given neuropathy.    HCM: Optho eye exam-clinic? HIV Microalbumin Pap,mammo

## 2022-11-02 NOTE — Patient Instructions (Addendum)
[name]. Robyn [name] por permitirnos brindarle su atencin hoy. Hoy discutimos:  Diabetes: Su A1c todava est alta. Estamos reabasteciendo sus medicamentos para la diabetes. Comenzaremos a administrar un nuevo medicamento llamado empaglaflozina. Tomar esto una vez al da. Reiniciaremos su metformina. Las instrucciones para tomar tabletas de Metformina de 500 mg: Tome 1 tableta con la cena durante 7 [place], luego 1 tableta con el desayuno y 1 tableta con la cena durante 7 [place], [place] 1 tableta con el desayuno y 2 tabletas con la cena durante 7 [place]. , luego 2 tab con desayuno y 2 tab con [employer].  Colesterol alto: le recetaremos 20 mg de rosuvastatina una vez al [employer].  Presin arterial alta: obtenga un manguito de presin arterial y mida su presin arterial por la Armed forces operational officer y por la noche [place]. Escrbalo en una hoja de papel y llvelo consigo en su prxima cita.  Dolor de espalda: puede tomar tylenol hasta 1000 mg cuatro veces al [employer]. Puedes tomar advil 400 mg cada 6 horas durante una semana. Despus de esto, tmelo slo segn sea necesario. Te dar en casa ejercicios de estiramiento que puedes hacer. Tambin puedes probar con una almohadilla trmica.  He ordenado los siguientes laboratorios para usted:  rdenes de laboratorio      Glucosa capilar      POC Hbg A1C    Referencias ordenadas hoy:  rdenes de referencia No se solicitaron referencias hoy    He ordenado/cambiado los siguientes medicamentos:  Suspenda los siguientes medicamentos: Medicamentos discontinuados durante este encuentro Motivo de la medicacin  tableta de metFORMINA (GLUCFAGO) de 1000 MG con receta vencida  lisinopril (PRINIVIL,ZESTRIL) tableta de 5 MG con receta vencida  metFORMINA (GLUCOPHAGE-XR) 500 MG tableta de 24 horas Reordenar  tableta de rosuvastatina (CRESTOR) de 20 MG Reordenar    Inicie los siguientes medicamentos: Los mdicos ordenaron este  encuentro Medicamentos  tableta de rosuvastatina (CRESTOR) de 20 mg Sig: T[place] 1 tableta (20 mg en total) por va oral al da. Dispensacin: 30 comprimidos Recarga: 11 PROGRAMA IM  metFORMINA (GLUCFAGO-XR) 500 MG tableta de 24 horas Sig: Tomar 1 comprimido con la cena durante 7 [place], luego 1 comprimido con el desayuno y 1 comprimido con la cena durante 7 [place], luego 1 comprimido con el desayuno y 2 comprimidos con la cena durante 7 [place], luego 2 comprimidos con el desayuno y 2 comprimidos con la cena todos los [place] Dispensacin: 48 comprimidos Recarga: 0 PROGRAMA IM Lo ped as por cantidad. Le dio instrucciones para realizar una titulacin tpica durante Software engineer mes de 500 mg por da durante 1 semana, luego 1000, luego 1500 y luego 2000 mg [employer]. por chat seguro 07-28-22 sdm  empagliflozina (JARDIANCE) tableta de 10 MG TABS Sig: Tome 1 tableta (10 mg en total) por va oral al da antes del desayuno. Dispensacin: 30 comprimidos Recarga: 1 Programa de mensajera instantnea    Seguimiento: 3 meses  Esperamos verte la prxima vez. Llame a nuestra clnica al [phone] si tiene alguna pregunta o inquietud. El mejor momento para llamar es de lunes a viernes de 9 a. m. a 4 p. m., pero hay alguien disponible las 24 horas, los 7 das de la [place]. Si es fuera del horario de atencin o durante el fin de [place], llame al nmero principal del hospital y pregunte por el residente de guardia de medicina interna. Si necesita reabastecimiento de medicamentos, notifique a su farmacia con una semana de anticipacin y ellos nos enviarn  una solicitud.  Gracias por confiarme tu cuidado. Deseandote lo mejor!  [name], [name] ___________________________________________________________________  Thank you, Robyn.Beatris Si Robyn [name] for allowing [place] to provide your care today. Today we discussed :  Diabetes: Your A1c is still high. We are refilling your  diabetes medications.We will start a new medicine called empaglaflozin. You will take this once daily. We will restart your metformin.The instructions for taking Metformin 576m tablets:Take 1  tablet with dinner for 7 days, then 1 tab with breakfast and 1 tab with dinner  for 7 days, then  1 tab with breakfast and 2 tab with dinner  for 7 days, then 2 tab with breakfast and 2 tab with dinner everyday.  High cholesterol: We will prescribe rosuvastatin 270monce daily.  High blood pressure: Please get a blood pressure cuff and measure your blood pressure in the mornings and evenings every couple of days. Write this down on a sheet of paper and bring it with you at your next appointment.  Back pain: You can take tylenol up to 1000 mg four times per day. You can take advil 400 mg every 6 hours for a week. Only take it as needed after this. I will give you at home stretching exercises that you can do. You can also try a heating pad.   I have ordered the following labs for you:  Lab Orders         Glucose, capillary         POC Hbg A1C        Referrals ordered today:   Referral Orders  No referral(s) requested today     I have ordered the following medication/changed the following medications:   Stop the following medications: Medications Discontinued During This Encounter  Medication Reason   metFORMIN (GLUCOPHAGE) 1000 MG tablet Expired Prescription   lisinopril (PRINIVIL,ZESTRIL) 5 MG tablet Expired Prescription   metFORMIN (GLUCOPHAGE-XR) 500 MG 24 hr tablet Reorder   rosuvastatin (CRESTOR) 20 MG tablet Reorder     Start the following medications: Meds ordered this encounter  Medications   rosuvastatin (CRESTOR) 20 MG tablet    Sig: Take 1 tablet (20 mg total) by mouth daily.    Dispense:  30 tablet    Refill:  11    IM PROGRAM   metFORMIN (GLUCOPHAGE-XR) 500 MG 24 hr tablet    Sig: Take 1  tablet with dinner for 7 days, then 1 tab with breakfast and 1 tab with dinner  for 7  days, then  1 tab with breakfast and 2 tab with dinner  for 7 days, then 2 tab with breakfast and 2 tab with dinner everyday    Dispense:  70 tablet    Refill:  0    IM PROGRAM  Ordered it that way for quantity. Gave her directions to do typical first month titration of 50085mer dayx 1 week then 1000, then 1500, then 2000 mg daily. per secure chat 07-28-22 sdm   empagliflozin (JARDIANCE) 10 MG TABS tablet    Sig: Take 1 tablet (10 mg total) by mouth daily before breakfast.    Dispense:  30 tablet    Refill:  1    IM Program     Follow up: 3 months     We look forward to seeing you next time. Please call our clinic at [phone] you have any questions or concerns. The best time to call is Monday-Friday from 9am-4pm, but there  is someone available 24/7. If after hours or the weekend, call the main hospital number and ask for the Internal Medicine Resident On-Call. If you need medication refills, please notify your pharmacy one week in advance and they will send [place] a request.   Thank you for trusting me with your care. Wishing you the best!   [name], MD [place]

## 2022-11-03 ENCOUNTER — Other Ambulatory Visit (HOSPITAL_COMMUNITY): Payer: Self-pay

## 2022-11-03 DIAGNOSIS — G5702 Lesion of sciatic nerve, left lower limb: Secondary | ICD-10-CM | POA: Insufficient documentation

## 2022-11-03 NOTE — Progress Notes (Deleted)
HTN BP in clinic found to be 151/69 and 153/70 on repeat. Her blood pressure has also been historically high on chart review. She denies CP,SOB, peripheral edema. Given long half life and renal protective nature will start irbasartan today. May need additional agents at next visit. BMP today with normal renal function and electrolytes, will check at follow up in 1 month. -irbasartan 138m daily  T2DM Patient appears to have T2DM diagnosis dating back to 2016 per chart review. This ahas always been poorly controlled. She endorses the most recent time seeing a doctor or taking medications was 2 years ago. A1c was 13.7 today and POC glucose was 235. The patient is obese, denies weight loss, increased thirst or urination and has not been hospitalized so does not appear to be ketosis prone. She denies claudication, foot sores or ulcers, hand/foot burning or tingling, but does endorse numbness of the big toes. Her foot exam was notable for loss of sensation to monofilament at multiple locations bilaterally. The patient denies vision changes but is have signs of nerve damage based on peripheral neuropathy. Started on metformin today. Her BMP demonstrated normal renal function although this does not exlude microalbuminuria.She will need a multi-medication regimen but did not want to start her on a GLP-1 due to simultaneous GI side effects with metformin and do not want to induce yeast infection or UTI with SGLT-2 at this time given such high A1c. Will see patient back in 1 month and will start GLP-1 at that time. If no improvement at next A1c will need to consider insulin. -metformin 5036mdaily x7 days, 50080mIDx7 days, 1000m25m500mg56mly x 7 days, 1000mg 25mas tolerated. -will get eye exam when she gets approved for orange card  HLD LDL found to be 144 today. Given severely uncontrolled T2DM goal LDL should be at least <100. Will need to start high intensity Rosuvastatin at next visit given neuropathy.    HCM: Optho eye exam-clinic? HIV Microalbumin Pap,mammo

## 2022-11-03 NOTE — Assessment & Plan Note (Signed)
LDL most recently 144. Given T2DM with neuropathy does need to be on high intensity statin. -start rosuvastatin 70m daily

## 2022-11-03 NOTE — Progress Notes (Signed)
Internal Medicine Clinic Attending  Case discussed with Dr. Stann Mainland  At the time of the visit.  We reviewed the resident's history and exam and pertinent patient test results.  I agree with the assessment, diagnosis, and plan of care documented in the resident's note.

## 2022-11-03 NOTE — Assessment & Plan Note (Signed)
Patient missed December appointment. Was started on irbesartan 16m prior to this. In clinic she is normotensive, although had hypertension to [id number] even on repeat at last visit. Instructed her to get a BP cuff and measure BP at home every couple of days. Holding antihypertensive at this time, plan to restart if home BP high or BP in clinic persistently high. -f/u BP log, treat hypertension if indicated
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
## 2023-02-01 ENCOUNTER — Encounter: Payer: Self-pay | Admitting: Student

## 2023-02-09 ENCOUNTER — Encounter: Payer: Self-pay | Admitting: Student

## 2023-02-15 ENCOUNTER — Encounter: Payer: Self-pay | Admitting: Student

## 2023-03-01 ENCOUNTER — Other Ambulatory Visit: Payer: Self-pay

## 2023-03-01 ENCOUNTER — Encounter: Payer: Self-pay | Admitting: Student

## 2023-03-01 ENCOUNTER — Other Ambulatory Visit (HOSPITAL_COMMUNITY): Payer: Self-pay

## 2023-03-01 ENCOUNTER — Ambulatory Visit: Payer: Self-pay | Admitting: Student

## 2023-03-01 VITALS — BP 140/64 | HR 70 | Temp 98.1°F | Ht 62.0 in | Wt 183.2 lb

## 2023-03-01 DIAGNOSIS — Z6833 Body mass index (BMI) 33.0-33.9, adult: Secondary | ICD-10-CM

## 2023-03-01 DIAGNOSIS — Z794 Long term (current) use of insulin: Secondary | ICD-10-CM

## 2023-03-01 DIAGNOSIS — E1142 Type 2 diabetes mellitus with diabetic polyneuropathy: Secondary | ICD-10-CM

## 2023-03-01 DIAGNOSIS — I10 Essential (primary) hypertension: Secondary | ICD-10-CM

## 2023-03-01 DIAGNOSIS — E785 Hyperlipidemia, unspecified: Secondary | ICD-10-CM

## 2023-03-01 DIAGNOSIS — E1165 Type 2 diabetes mellitus with hyperglycemia: Secondary | ICD-10-CM

## 2023-03-01 DIAGNOSIS — Z7984 Long term (current) use of oral hypoglycemic drugs: Secondary | ICD-10-CM

## 2023-03-01 DIAGNOSIS — E669 Obesity, unspecified: Secondary | ICD-10-CM

## 2023-03-01 DIAGNOSIS — E1169 Type 2 diabetes mellitus with other specified complication: Secondary | ICD-10-CM

## 2023-03-01 DIAGNOSIS — E6609 Other obesity due to excess calories: Secondary | ICD-10-CM

## 2023-03-01 DIAGNOSIS — Z139 Encounter for screening, unspecified: Secondary | ICD-10-CM

## 2023-03-01 LAB — POCT GLYCOSYLATED HEMOGLOBIN (HGB A1C): Hemoglobin A1C: 9.5 % — AB (ref 4.0–5.6)

## 2023-03-01 LAB — GLUCOSE, CAPILLARY: Glucose-Capillary: 168 mg/dL — ABNORMAL HIGH (ref 70–99)

## 2023-03-01 MED ORDER — TRULICITY 0.75 MG/0.5ML ~~LOC~~ SOAJ
0.7500 mg | SUBCUTANEOUS | 0 refills | Status: DC
  Filled 2023-03-01: qty 2, 28d supply, fill #0

## 2023-03-01 MED ORDER — IRBESARTAN 75 MG PO TABS
75.0000 mg | ORAL_TABLET | Freq: Every day | ORAL | 0 refills | Status: DC
  Filled 2023-03-01: qty 11, 11d supply, fill #0

## 2023-03-01 MED ORDER — EMPAGLIFLOZIN 10 MG PO TABS
10.0000 mg | ORAL_TABLET | Freq: Every day | ORAL | 11 refills | Status: DC
  Filled 2023-03-01: qty 30, 30d supply, fill #0
  Filled 2023-04-11: qty 30, 30d supply, fill #1

## 2023-03-01 MED ORDER — METFORMIN HCL ER 500 MG PO TB24
2000.0000 mg | ORAL_TABLET | Freq: Every day | ORAL | 11 refills | Status: DC
  Filled 2023-03-01: qty 120, 30d supply, fill #0
  Filled 2023-04-11: qty 120, 30d supply, fill #1
  Filled 2023-05-26: qty 120, 30d supply, fill #2
  Filled 2023-06-28: qty 120, 30d supply, fill #3
  Filled 2023-08-24: qty 120, 30d supply, fill #0
  Filled 2023-09-22: qty 120, 30d supply, fill #1
  Filled 2023-10-27 (×2): qty 120, 30d supply, fill #2
  Filled 2023-12-07: qty 120, 30d supply, fill #3
  Filled 2024-01-16: qty 120, 30d supply, fill #4
  Filled 2024-02-27: qty 120, 30d supply, fill #5

## 2023-03-01 MED ORDER — ROSUVASTATIN CALCIUM 20 MG PO TABS
20.0000 mg | ORAL_TABLET | Freq: Every day | ORAL | 11 refills | Status: DC
  Filled 2023-03-01: qty 30, 30d supply, fill #0
  Filled 2023-04-11: qty 30, 30d supply, fill #1
  Filled 2023-05-26: qty 30, 30d supply, fill #2
  Filled 2023-06-28: qty 30, 30d supply, fill #3
  Filled 2023-08-24: qty 30, 30d supply, fill #0
  Filled 2023-09-22: qty 30, 30d supply, fill #1
  Filled 2023-10-27 (×2): qty 30, 30d supply, fill #2
  Filled 2023-12-07: qty 30, 30d supply, fill #3
  Filled 2024-01-16: qty 30, 30d supply, fill #4

## 2023-03-01 NOTE — Assessment & Plan Note (Signed)
-   Patient to mail OC paper work today - Landscape architect for [employer] signed for Liraglutide coverage

## 2023-03-01 NOTE — Assessment & Plan Note (Signed)
-   refilled Crestor today

## 2023-03-01 NOTE — Assessment & Plan Note (Signed)
Lifestyle modifications reviewed today. Patient will also start GLP1 agonist

## 2023-03-01 NOTE — Patient Instructions (Signed)
[name] , gracias por permitirnos ayudarle hoy.  -Metformin tomatelo en la manana La primera semana, te tomas una pastilla al dia La segunda [employer] patillas al dia La tercera semana, te tomas 3 pastilla al dia La cuarta semana 4 pastillas  Liraglutida - trulicity - la inyeccion - una vez a la semana - limpia la zona de la barriga, y rotala cada semana - Recuerda que Financial risk analyst sonido es para la aguja y debes esperar 10-15 segundos para el segundo sonido - este ultimo es el medicamento - Podras tener unasa nauseas los primeros dias, pero suelen pasar con el uso del medicamento - Si sientes que hay mucha nausea o te sientes enferma, llamanos, y para Research scientist (medical) - Recibiras 4 semanas de medicamento esta vez en la farmacia de [place]. Los proximos debe llegar a tu casa, con [employer], que son los papeles de los que hablamos y que firmaste en la cita   He re-enviado el Irbesartan para la presion - Esta dosis es mas baja, es de 75 mg diarios - Monitorea tu presion arterial en la casa  Los demas medicamentos te los tomas como antes  Otros examenes que he ordenado hoy:  A1c que mide sus niveles de Chief Financial Officer en los ultimos 3 meses. La llamare cuando sepa el resultado.   Medicamentos:   NO continue tomando los siguientes medicamentos: Medications Discontinued During This Encounter  Medication Reason   metFORMIN (GLUCOPHAGE-XR) 500 MG 24 hr tablet    irbesartan (AVAPRO) 150 MG tablet         Cita de seguimiento: en un mes, pero debes llamar al inicio de Julio para hacer la cita  Para contactarnos: Esperamos verte la prxima vez. Llame a nuestra clnica al telefono: [phone] si tiene alguna pregunta o inquietud. El mejor horario para llamar es de lunes a viernes de 9 a. m. a 4 p. m. Si es fuera del horario de atencin o durante el fin de [place], llame al nmero principal del hospital y pregunte por el residente de guardia de medicina interna. Si necesita reposicin  de medicamentos, por favor notifique a su farmacia con una semana de anticipacin y ellos nos enviarn una solicitud.  Gracias por confiar en nosotros para cuidar de usted!   [name], MD [facility]

## 2023-03-01 NOTE — Assessment & Plan Note (Signed)
Last A1c 3 months ago, HGBA1C 10.9 (A)  on 11/02/2022. Patient initially took Metformin titration up to 2000 mg and Jardiance 25 mg daily without side effects. However, she ran out of medicines for the past two months asshe didn't have refills and did not know to call for it/was afraid people did not speak spanish here.  Will plan to recheck A1c, suspect it is elevated without medication coverage. Discussed lifestyle modifications and activity in daily life. Will restart metformin with fast titration, Jardiance 25 mg daily. Given how elevated A1c was previously, patient would benefit from GLP-1RA. Discussed with Siri Cole as I'd like to start patient on Liraglutide. Signed [employer] assistance program papers today and will send first month supply to 0.75 mg weekly to [employer] under [facility] assistance. Patient received education on how to use pen and about side effects. - A1c today - Liraglutide 0.75 mg weekly through [facility] at [facility] - [place] and Metfomin at IM program at [facility] community pharmacy -Return in 1 month for GLP-1 side effect monitoring and making sure patient gets Lilly supply

## 2023-03-01 NOTE — Assessment & Plan Note (Addendum)
Home blood pressure log consistently elevated. Hypertensive today. Previously on IrbesartanIrbesartan 150 mg but has been without medication for months. Patient is asymptomatic today - Will start Irbesartan 75 mg  - RTC in 4 weeks and repeat BMP

## 2023-03-01 NOTE — Progress Notes (Signed)
Subjective:  CC: 3 month follow up  HPI:  [name] is a 52 y.o. female with a past medical history stated below and presents today for HTN, DM, and hyperlipidemia. Please see problem based assessment and plan for additional details.  Past Medical History:  Diagnosis Date   Diabetes mellitus without complication (HCC)    Hypertension     Current Outpatient Medications on File Prior to Visit  Medication Sig Dispense Refill   Blood Glucose Monitoring Suppl (TRUE METRIX METER) w/Device KIT Check sugars daily 1 kit 0   glucose blood (TRUE METRIX BLOOD GLUCOSE TEST) test strip Use as instructed 100 each 12   naproxen (NAPROSYN) 500 MG tablet Take 1 tablet (500 mg total) by mouth 2 (two) times daily as needed. 30 tablet 0   TRUEPLUS LANCETS 28G MISC Check sugars daily 100 each 1   No current facility-administered medications on file prior to visit.    No family history on file.  Social History   Socioeconomic History   Marital status: Single    Spouse name: Not on file   Number of children: Not on file   Years of education: Not on file   Highest education level: Not on file  Occupational History   Not on file  Tobacco Use   Smoking status: Never   Smokeless tobacco: Never  Substance and Sexual Activity   Alcohol use: No    Alcohol/week: 0.0 standard drinks of alcohol   Drug use: No   Sexual activity: Never  Other Topics Concern   Not on file  Social History Narrative   Not on file   Social Determinants of Health   Financial Resource Strain: Not on file  Food Insecurity: No Food Insecurity (11/02/2022)   Hunger Vital Sign    Worried About Running Out of Food in the Last Year: Never true    Ran Out of Food in the Last Year: Never true  Transportation Needs: Not on file  Physical Activity: Not on file  Stress: Not on file  Social Connections: Moderately Integrated (11/02/2022)   Social Connection and Isolation Panel [NHANES]    Frequency of  Communication with Friends and Family: More than three times a week    Frequency of Social Gatherings with Friends and Family: More than three times a week    Attends Religious Services: More than 4 times per year    Active Member of [employer] or Organizations: No    Attends Banker Meetings: Never    Marital Status: Married  Catering manager Violence: Not At Risk (11/02/2022)   Humiliation, Afraid, Rape, and Kick questionnaire    Fear of Current or Ex-Partner: No    Emotionally Abused: No    Physically Abused: No    Sexually Abused: No    Review of Systems: ROS negative except for what is noted on the assessment and plan.  Objective:   Vitals:   03/01/23 1048  BP: (!) 140/64  Pulse: 70  Temp: 98.1 F (36.7 C)  TempSrc: Oral  SpO2: 100%  Weight: 183 lb 3.2 oz (83.1 kg)  Height: 5\' 2"  (1.575 m)    Physical Exam: Constitutional: well-appearing woman sitting in chair, in no acute distress HENT: normocephalic atraumatic, mucous membranes moist Eyes: conjunctiva non-erythematous Neck: supple Cardiovascular: regular rate and rhythm, no m/r/g Pulmonary/Chest: normal work of breathing on room air, lungs clear to auscultation bilaterally Abdominal: soft, non-tender, non-distended MSK: normal bulk and tone Neurological: alert & oriented x  3, 5/5 strength in bilateral upper and lower extremities, normal gait Skin: warm and dry Psych: Pleasant mood and affect       03/01/2023   10:50 AM  Depression screen PHQ 2/9  Decreased Interest 0  Down, Depressed, Hopeless 0  PHQ - 2 Score 0    Assessment & Plan:   Hypertension Home blood pressure log consistently elevated. Hypertensive today. Previously on IrbesartanIrbesartan 150 mg but has been without medication for months. Patient is asymptomatic today - Will start Irbesartan 75 mg  - RTC in 4 weeks and repeat BMP  Poorly controlled type 2 diabetes mellitus with peripheral neuropathy (HCC) Last A1c 3 months ago, HGBA1C  10.9 (A)  on 11/02/2022. Patient initially took Metformin titration up to 2000 mg and Jardiance 25 mg daily without side effects. However, she ran out of medicines for the past two months asshe didn't have refills and did not know to call for it/was afraid people did not speak spanish here.  Will plan to recheck A1c, suspect it is elevated without medication coverage. Discussed lifestyle modifications and activity in daily life. Will restart metformin with fast titration, Jardiance 25 mg daily. Given how elevated A1c was previously, patient would benefit from GLP-1RA. Discussed with Siri Cole as I'd like to start patient on Liraglutide. Signed [employer] assistance program papers today and will send first month supply to 0.75 mg weekly to [employer] under [facility] assistance. Patient received education on how to use pen and about side effects. - A1c today - Liraglutide 0.75 mg weekly through [facility] at [facility] - [place] and Metfomin at IM program at [facility] community pharmacy -Return in 1 month for GLP-1 side effect monitoring and making sure patient gets Lilly supply  Hyperlipidemia associated with type 2 diabetes mellitus (HCC) - refilled Crestor today  Class 1 obesity due to excess calories with serious comorbidity and body mass index (BMI) of 33.0 to 33.9 in adult Lifestyle modifications reviewed today. Patient will also start GLP1 agonist  Encounter for screening involving social determinants of health (SDoH) - Patient to mail [place] paper work today - Landscape architect for [employer] signed for Liraglutide coverage   Return in about 4 weeks (around 03/29/2023) for HTN, DM.  Patient discussed with Dr. Gardiner Ramus, MD [facility] Internal Medicine Program - PGY-1 03/01/2023, 1:05 PM

## 2023-03-02 ENCOUNTER — Other Ambulatory Visit: Payer: Self-pay

## 2023-03-02 ENCOUNTER — Other Ambulatory Visit (HOSPITAL_COMMUNITY): Payer: Self-pay

## 2023-03-03 ENCOUNTER — Other Ambulatory Visit: Payer: Self-pay

## 2023-03-03 NOTE — Progress Notes (Signed)
Internal Medicine Clinic Attending  Case discussed with Dr. Gomez-Caraballo  At the time of the visit.  We reviewed the resident's history and exam and pertinent patient test results.  I agree with the assessment, diagnosis, and plan of care documented in the resident's note.  

## 2023-03-07 ENCOUNTER — Telehealth: Payer: Self-pay

## 2023-03-07 ENCOUNTER — Other Ambulatory Visit: Payer: Self-pay

## 2023-03-07 NOTE — Telephone Encounter (Signed)
Submitted application for TRULICITY 0.75MG /0.5ML to [employer] for patient assistance.   Application may be denied - trulicity was not accepting new applicants at one point. May possibly need to do Victoza via IM program at [facility] Out[facility] pharmacy  Phone: [phone]

## 2023-03-08 NOTE — Telephone Encounter (Signed)
Received notification from [facility] CARES regarding patient assistance DENIAL for TRULICITY 0.75MG /0.5ML.   Attempted the enrollment but company still not accepting new applicants at the time. This is partially my fault as well for forgetting this!!  Would you like to maybe try Ozempic enrollment with [employer] instead? If approved, medication will ship to the doctors office every few months. Patient could also do Victoza on the IM Program at [facility] on [facility].

## 2023-04-11 ENCOUNTER — Other Ambulatory Visit (HOSPITAL_COMMUNITY): Payer: Self-pay

## 2023-04-13 ENCOUNTER — Other Ambulatory Visit: Payer: Self-pay | Admitting: Student

## 2023-04-13 ENCOUNTER — Other Ambulatory Visit: Payer: Self-pay

## 2023-04-13 ENCOUNTER — Other Ambulatory Visit (HOSPITAL_COMMUNITY): Payer: Self-pay

## 2023-04-13 DIAGNOSIS — I10 Essential (primary) hypertension: Secondary | ICD-10-CM

## 2023-04-13 DIAGNOSIS — Z139 Encounter for screening, unspecified: Secondary | ICD-10-CM

## 2023-04-13 DIAGNOSIS — E669 Obesity, unspecified: Secondary | ICD-10-CM

## 2023-04-13 DIAGNOSIS — E6609 Other obesity due to excess calories: Secondary | ICD-10-CM

## 2023-04-14 ENCOUNTER — Other Ambulatory Visit: Payer: Self-pay

## 2023-04-14 MED ORDER — IRBESARTAN 75 MG PO TABS
75.0000 mg | ORAL_TABLET | Freq: Every day | ORAL | 0 refills | Status: AC
  Filled 2023-04-14: qty 11, 11d supply, fill #0

## 2023-04-14 MED ORDER — TRULICITY 0.75 MG/0.5ML ~~LOC~~ SOAJ
0.7500 mg | SUBCUTANEOUS | 0 refills | Status: DC
  Filled 2023-04-14: qty 2, 28d supply, fill #0

## 2023-04-21 ENCOUNTER — Other Ambulatory Visit: Payer: Self-pay

## 2023-05-18 ENCOUNTER — Other Ambulatory Visit (HOSPITAL_COMMUNITY): Payer: Self-pay

## 2023-05-25 ENCOUNTER — Ambulatory Visit: Payer: Self-pay | Admitting: Student

## 2023-05-25 ENCOUNTER — Encounter: Payer: Self-pay | Admitting: Student

## 2023-05-25 ENCOUNTER — Telehealth: Payer: Self-pay

## 2023-05-25 ENCOUNTER — Other Ambulatory Visit: Payer: Self-pay

## 2023-05-25 VITALS — BP 138/68 | HR 65 | Temp 98.0°F | Ht 65.0 in | Wt 184.4 lb

## 2023-05-25 DIAGNOSIS — E1165 Type 2 diabetes mellitus with hyperglycemia: Secondary | ICD-10-CM

## 2023-05-25 DIAGNOSIS — Z139 Encounter for screening, unspecified: Secondary | ICD-10-CM

## 2023-05-25 DIAGNOSIS — E1142 Type 2 diabetes mellitus with diabetic polyneuropathy: Secondary | ICD-10-CM

## 2023-05-25 DIAGNOSIS — Z7984 Long term (current) use of oral hypoglycemic drugs: Secondary | ICD-10-CM

## 2023-05-25 DIAGNOSIS — I10 Essential (primary) hypertension: Secondary | ICD-10-CM

## 2023-05-25 DIAGNOSIS — E6609 Other obesity due to excess calories: Secondary | ICD-10-CM

## 2023-05-25 DIAGNOSIS — E1169 Type 2 diabetes mellitus with other specified complication: Secondary | ICD-10-CM

## 2023-05-25 DIAGNOSIS — E785 Hyperlipidemia, unspecified: Secondary | ICD-10-CM

## 2023-05-25 LAB — POCT GLYCOSYLATED HEMOGLOBIN (HGB A1C): Hemoglobin A1C: 10 % — AB (ref 4.0–5.6)

## 2023-05-25 LAB — GLUCOSE, CAPILLARY: Glucose-Capillary: 237 mg/dL — ABNORMAL HIGH (ref 70–99)

## 2023-05-25 MED ORDER — TRULICITY 0.75 MG/0.5ML ~~LOC~~ SOAJ
0.7500 mg | SUBCUTANEOUS | 0 refills | Status: DC
  Filled 2023-05-25: qty 2, 28d supply, fill #0

## 2023-05-25 MED ORDER — EMPAGLIFLOZIN 10 MG PO TABS
10.0000 mg | ORAL_TABLET | Freq: Every day | ORAL | 11 refills | Status: DC
  Filled 2023-05-25 (×2): qty 30, 30d supply, fill #0
  Filled 2023-06-28: qty 30, 30d supply, fill #1

## 2023-05-25 NOTE — Assessment & Plan Note (Signed)
Her last A1c was on 03/01/2023 was 9.5.  She is taking 2000 mg of metformin and 10 mg of Jardiance daily.  Is unsure if she has been taking these medications or not.  She denies any current signs or symptoms.  Per dispense history, it appears that she is not taking her medications.  We recommend following up in 1 to 2 weeks so she can bring her medications and we can determine if we need to make any necessary adjustments.  Her A1c increased to 10.0 today so it is likely that she has not been taking her medications.  Because we are unsure if she is taking her medications or not, we did not feel it safe to change her medications at this time. - Follow-up in 1 to 2 weeks - Refills for Jardiance and Trulicity

## 2023-05-25 NOTE — Progress Notes (Signed)
Internal Medicine Clinic Attending  I was physically present during the key portions of the resident provided service and participated in the medical decision making of patient's management care. I reviewed pertinent patient test results.  The assessment, diagnosis, and plan were formulated together and I agree with the documentation in the resident's note.  Mercie Eon, MD    This is a very challenging situation.  Patient with poorly controlled T2DM, A1C 10.0 today. Adherence has been difficult.  Unfortunately, I have no idea what medicines this patient is taking for her diabetes. She took the Dulaglutide 0.75mg  weekly injections for 4 doses, but then stopped when she ran out. I'm not sure if she's currently taking Jardiance or Metformin, but she thinks she is taking 1 pill for her diabetes. She did not bring her medicines in today.  I think the biggest thing we need to do is to do a proper medication reconciliation with the patient, an interpreter, and her medicines in hand. For today, we will refill the Dulaglutide 0.75mg  weekly (hopefully can increase dose at next visit) and continue Jardiance & Metformin. I expect she may need insulin, but I want to optimize adherence to SGLT2, GLP1, and Metformin first. I think starting insulin without better understanding of her diabetes & knowing what she's actually taking would be unsafe.

## 2023-05-25 NOTE — Patient Instructions (Addendum)
  Muchas gracias por venir a la clnica hoy!  Hoy hablamos sobre tu diabetes. Haremos un seguimiento en las prximas una o [employer]. Renov mi receta de Jardiance y Trulicity. En tu prxima cita, trae todos los medicamentos que ests tomando. Tambin recopilamos tu A1c y har un seguimiento con esos resultados.  Si tienes alguna pregunta, no dudes en llamar a la clnica en cualquier momento al [phone]. Fue Psychiatrist verte!  Saludos, Dr. Rayvon Char
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
## 2023-05-25 NOTE — Telephone Encounter (Signed)
-----   Message from Weldon Picking sent at 05/25/2023 10:50 AM EDT ----- Free trial card processed for Jardiance today, pharmacy will have patient complete a BI Cares application at pick up. Pharmacy also filled [facility] Trulicity today.
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
## 2023-05-25 NOTE — Telephone Encounter (Signed)
Awaiting completed BI Cares application from patient

## 2023-05-26 ENCOUNTER — Other Ambulatory Visit (HOSPITAL_COMMUNITY): Payer: Self-pay

## 2023-05-26 ENCOUNTER — Other Ambulatory Visit: Payer: Self-pay

## 2023-05-29 ENCOUNTER — Other Ambulatory Visit: Payer: Self-pay

## 2023-05-29 NOTE — Progress Notes (Signed)
Attempted to call patient with interpreter on line to discuss A1c and glucose. No reply so voice message was left discussing these findings and that we will f/u at our next appointment. Elevated values likely 2/2 to medication non-adherence.

## 2023-06-02 ENCOUNTER — Other Ambulatory Visit: Payer: Self-pay

## 2023-06-06 NOTE — Telephone Encounter (Signed)
Rec'd completed patient pages.   Awaiting signature from provider.

## 2023-06-07 ENCOUNTER — Ambulatory Visit: Payer: Self-pay | Admitting: Student

## 2023-06-07 ENCOUNTER — Other Ambulatory Visit (HOSPITAL_COMMUNITY): Payer: Self-pay

## 2023-06-07 ENCOUNTER — Encounter: Payer: Self-pay | Admitting: Student

## 2023-06-07 ENCOUNTER — Other Ambulatory Visit: Payer: Self-pay

## 2023-06-07 VITALS — BP 134/61 | HR 81 | Temp 98.4°F | Ht 64.0 in | Wt 181.3 lb

## 2023-06-07 DIAGNOSIS — Z7984 Long term (current) use of oral hypoglycemic drugs: Secondary | ICD-10-CM

## 2023-06-07 DIAGNOSIS — E1165 Type 2 diabetes mellitus with hyperglycemia: Secondary | ICD-10-CM

## 2023-06-07 DIAGNOSIS — Z794 Long term (current) use of insulin: Secondary | ICD-10-CM

## 2023-06-07 DIAGNOSIS — E1142 Type 2 diabetes mellitus with diabetic polyneuropathy: Secondary | ICD-10-CM

## 2023-06-07 MED ORDER — TRUEPLUS LANCETS 28G MISC
1 refills | Status: AC
  Filled 2023-06-07 – 2023-08-24 (×3): qty 100, 30d supply, fill #0

## 2023-06-07 MED ORDER — TRUE METRIX METER W/DEVICE KIT
PACK | 0 refills | Status: AC
  Filled 2023-06-07: qty 1, 30d supply, fill #0

## 2023-06-07 MED ORDER — TRUE METRIX BLOOD GLUCOSE TEST VI STRP
ORAL_STRIP | 12 refills | Status: DC
  Filled 2023-06-07: qty 50, 30d supply, fill #0

## 2023-06-07 NOTE — Progress Notes (Signed)
CC: Diabetes follow-up  HPI: Robyn [name] is a 52 y.o. female living with a history stated below and presents today for diabetes follow-up. Please see problem based assessment and plan for additional details.  Past Medical History:  Diagnosis Date   Diabetes mellitus without complication (HCC)    Hypertension     Current Outpatient Medications on File Prior to Visit  Medication Sig Dispense Refill   Dulaglutide (TRULICITY) 0.75 MG/0.5ML SOPN Inject 0.75 mg into the skin once a week. 2 mL 0   empagliflozin (JARDIANCE) 10 MG TABS tablet Take 1 tablet (10 mg total) by mouth daily before breakfast. 30 tablet 11   irbesartan (AVAPRO) 75 MG tablet Take 1 tablet (75 mg total) by mouth daily for 11 days. 11 tablet 0   metFORMIN (GLUCOPHAGE-XR) 500 MG 24 hr tablet Take 4 tablets (2,000 mg total) by mouth daily with breakfast. 120 tablet 11   naproxen (NAPROSYN) 500 MG tablet Take 1 tablet (500 mg total) by mouth 2 (two) times daily as needed. 30 tablet 0   rosuvastatin (CRESTOR) 20 MG tablet Take 1 tablet (20 mg total) by mouth daily. 30 tablet 11   No current facility-administered medications on file prior to visit.    No family history on file.  Social History   Socioeconomic History   Marital status: Single    Spouse name: Not on file   Number of children: Not on file   Years of education: Not on file   Highest education level: Not on file  Occupational History   Not on file  Tobacco Use   Smoking status: Never   Smokeless tobacco: Never  Substance and Sexual Activity   Alcohol use: No    Alcohol/week: 0.0 standard drinks of alcohol   Drug use: No   Sexual activity: Never  Other Topics Concern   Not on file  Social History Narrative   Not on file   Social Determinants of Health   Financial Resource Strain: Not on file  Food Insecurity: No Food Insecurity (11/02/2022)   Hunger Vital Sign    Worried About Running Out of Food in the Last Year: Never true     Ran Out of Food in the Last Year: Never true  Transportation Needs: Not on file  Physical Activity: Not on file  Stress: Not on file  Social Connections: Moderately Integrated (11/02/2022)   Social Connection and Isolation Panel [NHANES]    Frequency of Communication with Friends and Family: More than three times a week    Frequency of Social Gatherings with Friends and Family: More than three times a week    Attends Religious Services: More than 4 times per year    Active Member of [employer] or Organizations: No    Attends Banker Meetings: Never    Marital Status: Married  Catering manager Violence: Not At Risk (11/02/2022)   Humiliation, Afraid, Rape, and Kick questionnaire    Fear of Current or Ex-Partner: No    Emotionally Abused: No    Physically Abused: No    Sexually Abused: No    Review of Systems: ROS negative except for what is noted on the assessment and plan.  Vitals:   06/07/23 1431  BP: 134/61  Pulse: 81  Temp: 98.4 F (36.9 C)  TempSrc: Oral  SpO2: 99%  Weight: 181 lb 4.8 oz (82.2 kg)  Height: 5\' 4"  (1.626 m)    Physical Exam: Constitutional: well-appearing in no acute distress HENT: normocephalic atraumatic,  mucous membranes moist Eyes: conjunctiva non-erythematous Neck: supple Cardiovascular: regular rate and rhythm, no m/r/g Pulmonary/Chest: normal work of breathing on room air, lungs clear to auscultation bilaterally Abdominal: soft, non-tender, non-distended MSK: normal bulk and tone Neurological: alert & oriented x 3, 5/5 strength in bilateral upper and lower extremities, normal gait Skin: warm and dry  Assessment & Plan:   Poorly controlled type 2 diabetes mellitus with peripheral neuropathy (HCC) Patient was last seen in the clinic 2 weeks ago but had not been taking her medications as she had run out.  At this time, her A1c was 10.0.  We were unsure of what medications she had been taking.  Today, she brought in her medications and  confirmed that she was taking 2000 mg of metformin daily, 20 mg of rosuvastatin daily, Trulicity 0.75 mg/week, and Jardiance 10 mg daily.  The patient noted that she had not been taking her glucose at home as her meter broke 2 years ago.  Her daughter who also has diabetes has a monitor and the patient took her glucose using this monitor.  Her glucose was 145 in the morning.  The patient denies any lows with no symptoms of lightheadedness or dizziness.  The patient also denies any highs with symptoms of polyuria or polydipsia.  Patient has never used CGM.  We will follow-up with the patient in 2 weeks and reevaluate and possibly increase her Trulicity to 1.5 mg/week.  We also put in an order for a monitor, lancets, and strips encouraged the patient to take her glucose once daily. - Continue Jardiance 10 mg daily and metformin 2000 mg daily - Consider increasing Trulicity to 1.5 mg/week in 2 weeks - Consider adding long-acting insulin at next visit pending her glucose levels - Follow-up in 2 weeks  Patient seen with Dr. Mariea Stable, MD  [facility] Internal Medicine, PGY-1 Date 06/07/2023 Time 3:22 PM

## 2023-06-07 NOTE — Patient Instructions (Addendum)
Thank you so much for coming to the clinic today!   I have put in an order for your glucose meter, lancets, and strips. Please take your glucose in the morning and record this. We will see you in two weeks.   If you have any questions please feel free to the call the clinic at anytime at [phone]. It was a pleasure seeing you!  Best, Dr. Benita Stabile gracias por venir a la clnica hoy!  He hecho un pedido de su medidor de glucosa, lancetas y tiras. Tmese la glucosa por la maana y regstrela. Nos vemos en dos semanas.  Si tiene [employer], no dude en llamar a la clnica en cualquier momento al [phone]. Fue Psychiatrist verlo!  Atentamente, Dr. Rayvon Char

## 2023-06-07 NOTE — Telephone Encounter (Signed)
Submitted application for [employer] to [employer] CARES [employer]) for patient assistance.   Phone: [phone]

## 2023-06-07 NOTE — Assessment & Plan Note (Signed)
Patient was last seen in the clinic 2 weeks ago but had not been taking her medications as she had run out.  At this time, her A1c was 10.0.  We were unsure of what medications she had been taking.  Today, she brought in her medications and confirmed that she was taking 2000 mg of metformin daily, 20 mg of rosuvastatin daily, Trulicity 0.75 mg/week, and Jardiance 10 mg daily.  The patient noted that she had not been taking her glucose at home as her meter broke 2 years ago.  Her daughter who also has diabetes has a monitor and the patient took her glucose using this monitor.  Her glucose was 145 in the morning.  The patient denies any lows with no symptoms of lightheadedness or dizziness.  The patient also denies any highs with symptoms of polyuria or polydipsia.  Patient has never used CGM.  We will follow-up with the patient in 2 weeks and reevaluate and possibly increase her Trulicity to 1.5 mg/week.  We also put in an order for a monitor, lancets, and strips encouraged the patient to take her glucose once daily. - Continue Jardiance 10 mg daily and metformin 2000 mg daily - Consider increasing Trulicity to 1.5 mg/week in 2 weeks - Consider adding long-acting insulin at next visit pending her glucose levels - Follow-up in 2 weeks

## 2023-06-08 ENCOUNTER — Other Ambulatory Visit (HOSPITAL_COMMUNITY): Payer: Self-pay

## 2023-06-09 ENCOUNTER — Other Ambulatory Visit (HOSPITAL_COMMUNITY): Payer: Self-pay

## 2023-06-09 NOTE — Progress Notes (Signed)
Internal Medicine Clinic Attending  I was physically present during the key portions of the resident provided service and participated in the medical decision making of patient's management care. I reviewed pertinent patient test results.  The assessment, diagnosis, and plan were formulated together and I agree with the documentation in the resident's note.  Unclear if Ms. Zapata-Moreno had been taking all diabetes medications regularly prior to last visit when refills were written. Dispense report does not appear to be consistent with full adherence. Today, we discussed return in 2 weeks for BG review following meter prescription and likely adjustment of GLP-1 at that time.  Dickie La, MD

## 2023-06-09 NOTE — Addendum Note (Signed)
Addended by: Dickie La on: 06/09/2023 08:58 AM   Modules accepted: Level of Service

## 2023-06-22 ENCOUNTER — Encounter: Payer: Self-pay | Admitting: Internal Medicine

## 2023-06-22 NOTE — Progress Notes (Deleted)
CC: diabetes  HPI:  Robyn [name] is a 52 y.o. female living with a history stated below and presents today for a 2 week follow up of her diabetes. Please see problem based assessment and plan for additional details.  Past Medical History:  Diagnosis Date   Diabetes mellitus without complication (HCC)    Hypertension     Current Outpatient Medications on File Prior to Visit  Medication Sig Dispense Refill   Blood Glucose Monitoring Suppl (TRUE METRIX METER) w/Device KIT Check sugars daily 1 kit 0   Dulaglutide (TRULICITY) 0.75 MG/0.5ML SOPN Inject 0.75 mg into the skin once a week. 2 mL 0   empagliflozin (JARDIANCE) 10 MG TABS tablet Take 1 tablet (10 mg total) by mouth daily before breakfast. 30 tablet 11   glucose blood (TRUE METRIX BLOOD GLUCOSE TEST) test strip Use as instructed 100 each 12   irbesartan (AVAPRO) 75 MG tablet Take 1 tablet (75 mg total) by mouth daily for 11 days. 11 tablet 0   metFORMIN (GLUCOPHAGE-XR) 500 MG 24 hr tablet Take 4 tablets (2,000 mg total) by mouth daily with breakfast. 120 tablet 11   naproxen (NAPROSYN) 500 MG tablet Take 1 tablet (500 mg total) by mouth 2 (two) times daily as needed. 30 tablet 0   rosuvastatin (CRESTOR) 20 MG tablet Take 1 tablet (20 mg total) by mouth daily. 30 tablet 11   TRUEplus Lancets 28G MISC Check sugars daily 100 each 1   No current facility-administered medications on file prior to visit.    No family history on file.  Social History   Socioeconomic History   Marital status: Single    Spouse name: Not on file   Number of children: Not on file   Years of education: Not on file   Highest education level: Not on file  Occupational History   Not on file  Tobacco Use   Smoking status: Never   Smokeless tobacco: Never  Substance and Sexual Activity   Alcohol use: No    Alcohol/week: 0.0 standard drinks of alcohol   Drug use: No   Sexual activity: Never  Other Topics Concern   Not on file  Social  History Narrative   Not on file   Social Determinants of Health   Financial Resource Strain: Not on file  Food Insecurity: No Food Insecurity (11/02/2022)   Hunger Vital Sign    Worried About Running Out of Food in the Last Year: Never true    Ran Out of Food in the Last Year: Never true  Transportation Needs: Not on file  Physical Activity: Not on file  Stress: Not on file  Social Connections: Moderately Integrated (11/02/2022)   Social Connection and Isolation Panel [NHANES]    Frequency of Communication with Friends and Family: More than three times a week    Frequency of Social Gatherings with Friends and Family: More than three times a week    Attends Religious Services: More than 4 times per year    Active Member of [employer] or Organizations: No    Attends Banker Meetings: Never    Marital Status: Married  Catering manager Violence: Not At Risk (11/02/2022)   Humiliation, Afraid, Rape, and Kick questionnaire    Fear of Current or Ex-Partner: No    Emotionally Abused: No    Physically Abused: No    Sexually Abused: No    Review of Systems: ROS negative except for what is noted on the assessment and plan.  There were no vitals filed for this visit.  Physical Exam: Constitutional: well-appearing *** sitting in ***, in no acute distress HENT: normocephalic atraumatic, mucous membranes moist Eyes: conjunctiva non-erythematous Cardiovascular: regular rate and rhythm, no m/r/g Pulmonary/Chest: normal work of breathing on room air, lungs clear to auscultation bilaterally Abdominal: soft, non-tender, non-distended MSK: normal bulk and tone Neurological: alert & oriented x 3, no focal deficit Skin: warm and dry Psych: normal mood and behavior  Assessment & Plan:   DM: review BG and adjust GLP1  - A1c 10% - metformin 1 g bid - trulicity 0.75 mg/week > increase to 1.5 mg  - jardiance 10 mg > inc to 25? - ophtho  Patient {GC/GE:[id number]::"discussed with","seen  with"} Dr. {[id number]::"OZHYQMVH","Q. Hoffman","Mullen","Narendra","Vincent","Guilloud","Lau","Machen"}  No problem-specific Assessment & Plan notes found for this encounter.   [name], D.O. [facility] Internal Medicine, PGY-3 Phone: [phone] Date 06/22/2023 Time 7:17 AM

## 2023-06-28 ENCOUNTER — Other Ambulatory Visit: Payer: Self-pay | Admitting: Student

## 2023-06-28 ENCOUNTER — Other Ambulatory Visit: Payer: Self-pay

## 2023-06-28 DIAGNOSIS — E66811 Other obesity due to excess calories: Secondary | ICD-10-CM

## 2023-06-28 DIAGNOSIS — E1169 Type 2 diabetes mellitus with other specified complication: Secondary | ICD-10-CM

## 2023-06-28 DIAGNOSIS — Z139 Encounter for screening, unspecified: Secondary | ICD-10-CM

## 2023-06-28 MED ORDER — TRULICITY 0.75 MG/0.5ML ~~LOC~~ SOAJ
0.7500 mg | SUBCUTANEOUS | 0 refills | Status: DC
  Filled 2023-06-28: qty 2, 28d supply, fill #0

## 2023-06-29 ENCOUNTER — Other Ambulatory Visit: Payer: Self-pay

## 2023-06-29 ENCOUNTER — Other Ambulatory Visit (HOSPITAL_COMMUNITY): Payer: Self-pay

## 2023-06-30 ENCOUNTER — Other Ambulatory Visit: Payer: Self-pay

## 2023-06-30 NOTE — Telephone Encounter (Signed)
Refaxed application with updated patient pages

## 2023-07-11 ENCOUNTER — Other Ambulatory Visit: Payer: Self-pay

## 2023-07-17 ENCOUNTER — Other Ambulatory Visit: Payer: Self-pay

## 2023-07-17 ENCOUNTER — Ambulatory Visit: Payer: Self-pay | Admitting: Student

## 2023-07-17 ENCOUNTER — Other Ambulatory Visit (HOSPITAL_COMMUNITY): Payer: Self-pay

## 2023-07-17 VITALS — BP 146/60 | HR 66 | Temp 98.1°F | Ht 64.0 in | Wt 179.9 lb

## 2023-07-17 DIAGNOSIS — Z794 Long term (current) use of insulin: Secondary | ICD-10-CM

## 2023-07-17 DIAGNOSIS — Z139 Encounter for screening, unspecified: Secondary | ICD-10-CM

## 2023-07-17 DIAGNOSIS — Z6833 Body mass index (BMI) 33.0-33.9, adult: Secondary | ICD-10-CM

## 2023-07-17 DIAGNOSIS — E1169 Type 2 diabetes mellitus with other specified complication: Secondary | ICD-10-CM

## 2023-07-17 DIAGNOSIS — E6609 Other obesity due to excess calories: Secondary | ICD-10-CM

## 2023-07-17 DIAGNOSIS — E1142 Type 2 diabetes mellitus with diabetic polyneuropathy: Secondary | ICD-10-CM

## 2023-07-17 DIAGNOSIS — E1165 Type 2 diabetes mellitus with hyperglycemia: Secondary | ICD-10-CM

## 2023-07-17 DIAGNOSIS — E66811 Obesity, class 1: Secondary | ICD-10-CM

## 2023-07-17 MED ORDER — GABAPENTIN 300 MG PO CAPS
300.0000 mg | ORAL_CAPSULE | Freq: Every day | ORAL | 2 refills | Status: AC
  Filled 2023-07-17: qty 90, 90d supply, fill #0
  Filled 2023-07-18 – 2023-08-24 (×3): qty 30, 30d supply, fill #0
  Filled 2023-09-22: qty 30, 30d supply, fill #1
  Filled 2023-12-07: qty 30, 30d supply, fill #3
  Filled 2024-01-16: qty 30, 30d supply, fill #4
  Filled 2024-03-12: qty 30, 30d supply, fill #5

## 2023-07-17 MED ORDER — TRULICITY 1.5 MG/0.5ML ~~LOC~~ SOAJ
1.5000 mg | SUBCUTANEOUS | 0 refills | Status: DC
  Filled 2023-07-17: qty 2, 28d supply, fill #0
  Filled 2023-07-17: qty 4, 28d supply, fill #0

## 2023-07-17 MED ORDER — TRUE METRIX BLOOD GLUCOSE TEST VI STRP
ORAL_STRIP | 12 refills | Status: AC
  Filled 2023-07-17 – 2023-08-24 (×2): qty 50, 30d supply, fill #0
  Filled 2023-09-22: qty 50, 30d supply, fill #1
  Filled 2023-10-27: qty 50, 30d supply, fill #2
  Filled 2024-03-12: qty 100, 30d supply, fill #2

## 2023-07-17 NOTE — Patient Instructions (Signed)
Robyn [name], Robyn [name] por permitirnos brindarle atencin hoy. Hoy discutimos su diabetes y cambios en sus medicamentos.   He ordenado los siguientes laboratorios para usted:  Lab Orders  No laboratory test(s) ordered today     Pruebas ordenadas hoy:  Referencias ordenadas hoy:  Referral Orders  No referral(s) requested today     He ordenado el siguiente medicamento/cambiado los siguientes medicamentos:  Suspender los siguientes medicamentos: Medications Discontinued During This Encounter  Medication Reason   Dulaglutide (TRULICITY) 0.75 MG/0.5ML SOPN Reorder     Iniciar los siguientes medicamentos: Meds ordered this encounter  Medications   Dulaglutide (TRULICITY) 0.75 MG/0.5ML SOAJ    Sig: Inject 1.5 mg into the skin once a week.    Dispense:  4 mL    Refill:  0    DOH   gabapentin (NEURONTIN) 300 MG capsule    Sig: Take 1 capsule (300 mg total) by mouth at bedtime.    Dispense:  90 capsule    Refill:  2     Seguimiento 4 semanas/1 mes   Recordar:   - Recojer el medicamento de gabapentin 300 mg que va a tomar en la noche para lo ha estado sintiendo [employer] y la pierna que probablemente esta asociado con su diabetes.   - Rocojer el nuevo Trulicity que va a ser de 1.5 mg. Acuerdese que quizas sienta malestar del estomago o tenga diarrhea mientras que subimos la dosis, pero lo mas que toma esta nuevas dosis lo menos que va a Armed forces logistics/support/administrative officer.   - Usted va a completar la forma de [employer] Card y despues de Warehouse manager ese seguro medico la referiremos a un doctor de los ojor para un chequeo y tambien repetiremos unos examenes de Retail buyer.    Si tiene alguna pregunta o inquietud, llame a la clnica de medicina interna al [phone].    Marbeth Smedley Colbert Coyer, MD PGY-1 Internal Medicine Teaching Program  [facility]

## 2023-07-17 NOTE — Progress Notes (Unsigned)
New Patient Office Visit  Subjective    Patient ID: Robyn [name], female    DOB: September 21, 1970  Age: 52 y.o. MRN: [id number]  CC:  Chief Complaint  Patient presents with   Follow-up    HPI Robyn [name] presents to establish care ***  Outpatient Encounter Medications as of 07/17/2023  Medication Sig   gabapentin (NEURONTIN) 300 MG capsule Take 1 capsule (300 mg total) by mouth at bedtime.   Blood Glucose Monitoring Suppl (TRUE METRIX METER) w/Device KIT Check sugars daily   Dulaglutide (TRULICITY) 0.75 MG/0.5ML SOAJ Inject 1.5 mg into the skin once a week.   empagliflozin (JARDIANCE) 10 MG TABS tablet Take 1 tablet (10 mg total) by mouth daily before breakfast.   glucose blood (TRUE METRIX BLOOD GLUCOSE TEST) test strip Use as instructed   irbesartan (AVAPRO) 75 MG tablet Take 1 tablet (75 mg total) by mouth daily for 11 days.   metFORMIN (GLUCOPHAGE-XR) 500 MG 24 hr tablet Take 4 tablets (2,000 mg total) by mouth daily with breakfast.   naproxen (NAPROSYN) 500 MG tablet Take 1 tablet (500 mg total) by mouth 2 (two) times daily as needed.   rosuvastatin (CRESTOR) 20 MG tablet Take 1 tablet (20 mg total) by mouth daily.   TRUEplus Lancets 28G MISC Check sugars daily   [DISCONTINUED] Dulaglutide (TRULICITY) 0.75 MG/0.5ML SOPN Inject 0.75 mg into the skin once a week.   [DISCONTINUED] glucose blood (TRUE METRIX BLOOD GLUCOSE TEST) test strip Use as instructed   No facility-administered encounter medications on file as of 07/17/2023.    Past Medical History:  Diagnosis Date   Diabetes mellitus without complication (HCC)    Hypertension     No past surgical history on file.  No family history on file.  Social History   Socioeconomic History   Marital status: Single    Spouse name: Not on file   Number of children: Not on file   Years of education: Not on file   Highest education level: Not on file  Occupational History   Not on file  Tobacco Use    Smoking status: Never   Smokeless tobacco: Never  Substance and Sexual Activity   Alcohol use: No    Alcohol/week: 0.0 standard drinks of alcohol   Drug use: No   Sexual activity: Never  Other Topics Concern   Not on file  Social History Narrative   Not on file   Social Determinants of Health   Financial Resource Strain: Not on file  Food Insecurity: No Food Insecurity (11/02/2022)   Hunger Vital Sign    Worried About Running Out of Food in the Last Year: Never true    Ran Out of Food in the Last Year: Never true  Transportation Needs: Not on file  Physical Activity: Not on file  Stress: Not on file  Social Connections: Moderately Integrated (11/02/2022)   Social Connection and Isolation Panel [NHANES]    Frequency of Communication with Friends and Family: More than three times a week    Frequency of Social Gatherings with Friends and Family: More than three times a week    Attends Religious Services: More than 4 times per year    Active Member of [employer] or Organizations: No    Attends Banker Meetings: Never    Marital Status: Married  Catering manager Violence: Not At Risk (11/02/2022)   Humiliation, Afraid, Rape, and Kick questionnaire    Fear of Current or Ex-Partner: No  Emotionally Abused: No    Physically Abused: No    Sexually Abused: No    ROS      Objective    BP (!) 146/60 (BP Location: Right Arm, Patient Position: Sitting, Cuff Size: Small)   Pulse 66   Temp 98.1 F (36.7 C) (Oral)   Ht 5\' 4"  (1.626 m)   Wt 179 lb 14.4 oz (81.6 kg)   SpO2 100%   BMI 30.88 kg/m   Physical Exam  {Labs (Optional):23779}    Assessment & Plan:   Problem List Items Addressed This Visit       Other   Class 1 obesity due to excess calories with serious comorbidity and body mass index (BMI) of 33.0 to 33.9 in adult   Relevant Medications   Dulaglutide (TRULICITY) 0.75 MG/0.5ML SOAJ   Encounter for screening involving social determinants of health  (SDoH)   Relevant Medications   Dulaglutide (TRULICITY) 0.75 MG/0.5ML SOAJ   Other Visit Diagnoses     Type 2 diabetes mellitus with obesity (HCC)       Relevant Medications   Dulaglutide (TRULICITY) 0.75 MG/0.5ML SOAJ   Type 2 diabetes mellitus with hyperglycemia, unspecified whether long term insulin use (HCC)       Relevant Medications   Dulaglutide (TRULICITY) 0.75 MG/0.5ML SOAJ   glucose blood (TRUE METRIX BLOOD GLUCOSE TEST) test strip       No follow-ups on file.   Deatrice Spanbauer Colbert Coyer, MD

## 2023-07-17 NOTE — Progress Notes (Signed)
Established Patient Office Visit  Subjective   Patient ID: Robyn [name], female    DOB: [birth year]  Age: 52 y.o. MRN: [id number]  Chief Complaint  Patient presents with   Follow-up    Patient is a 52 yo with a past medical history stated below who presents today for follow-up for diabetes. Please see problem based assessment and plan for additional details.      Past Medical History:  Diagnosis Date   Diabetes mellitus without complication (HCC)    Hypertension     Review of Systems  Gastrointestinal:  Negative for abdominal pain, constipation, diarrhea, nausea and vomiting.  Genitourinary:  Negative for dysuria.     Objective:    BP (!) 146/60 (BP Location: Right Arm, Patient Position: Sitting, Cuff Size: Small)   Pulse 66   Temp 98.1 F (36.7 C) (Oral)   Ht 5\' 4"  (1.626 m)   Wt 179 lb 14.4 oz (81.6 kg)   SpO2 100%   BMI 30.88 kg/m  BP Readings from Last 3 Encounters:  07/17/23 (!) 146/60  06/07/23 134/61  05/25/23 138/68   Wt Readings from Last 3 Encounters:  07/17/23 179 lb 14.4 oz (81.6 kg)  06/07/23 181 lb 4.8 oz (82.2 kg)  05/25/23 184 lb 6.4 oz (83.6 kg)     Physical Exam HENT:     Head: Normocephalic and atraumatic.     Nose: Nose normal.     Mouth/Throat:     Mouth: Mucous membranes are moist.  Cardiovascular:     Rate and Rhythm: Normal rate and regular rhythm.     Pulses: Normal pulses.     Heart sounds: Normal heart sounds.  Pulmonary:     Effort: Pulmonary effort is normal.  Abdominal:     General: Bowel sounds are normal.     Palpations: Abdomen is soft.  Musculoskeletal:     Comments: Patient moving bilateral feet/toes spontaneously and when prompted, bilateral sensation of lower extremities intact, bilateral dorsal pedal pulses palpable, no blisters/injuries observed on either foot  Skin:    General: Skin is warm and dry.  Neurological:     General: No focal deficit present.     Mental Status: She is alert.  Psychiatric:         Mood and Affect: Mood normal.        Behavior: Behavior normal.    No results found for any visits on 07/17/23.  The 10-year ASCVD risk score (Arnett DK, et al., 2019) is: 5.2%    Assessment & Plan:   Problem List Items Addressed This Visit       Endocrine   Poorly controlled type 2 diabetes mellitus with peripheral neuropathy (HCC) - Primary    Patient currently taking Metformin 2000 mg daily with breakfast, Jardiance 10 mg daily, and Trulicity 0.75 mg weekly injections. Denies any abdominal pain, dysuria, nausea, vomiting, or diarrhea. States her fasting CBGs have ranged between 130-140, reports highest level as ~170. Denies any low glucose levels <60. Based on fasting glucose range, no need for long acting insulin at this time. Will increase Trulicity dose today.   Patient also endorsing foot heaviness and electric/shock like sensation down her leg. Denied crawling ants sensation, no prior history of anemia in chart. Description seems consistent with neuropathic pain. Patient states it's worse around bedtime. Agreeable to starting gabapentin today.  - START dulaglutide/Trulicity 1.5 mg weekly injections - STOP dulaglutide/Trulicity 0.75 mg weekly injections - START gabapentin 300 mg daily at bedtime  Relevant Medications   Dulaglutide (TRULICITY) 1.5 MG/0.5ML SOAJ   gabapentin (NEURONTIN) 300 MG capsule     Other   Class 1 obesity due to excess calories with serious comorbidity and body mass index (BMI) of 33.0 to 33.9 in adult    Patient doing well with weekly injections of GLP-1 agonist. Will increase to dulaglutide/Trulicity 1.5 mg weekly injections today.       Relevant Medications   Dulaglutide (TRULICITY) 1.5 MG/0.5ML SOAJ   Encounter for screening involving social determinants of health (SDoH)    Patient applied to [employer], states she is currently unemployed. Her husband was previously able to help with health care expenses but has also been out of work  due to recent lower limb amputation from his diabetes. Patient's daughters are able to provide some financial support. Will continue to follow up on OC application and will send prescriptions under IM program.       Relevant Medications   Dulaglutide (TRULICITY) 1.5 MG/0.5ML SOAJ   Other Visit Diagnoses     Type 2 diabetes mellitus with obesity (HCC)       Relevant Medications   Dulaglutide (TRULICITY) 1.5 MG/0.5ML SOAJ   Type 2 diabetes mellitus with hyperglycemia, unspecified whether long term insulin use (HCC)       Relevant Medications   Dulaglutide (TRULICITY) 1.5 MG/0.5ML SOAJ   glucose blood (TRUE METRIX BLOOD GLUCOSE TEST) test strip      Return in about 4 weeks (around 08/14/2023) for Diabetes medication management and insurance follow up [employer]).  Patient seen with Dr. Lafonda Mosses.   Dalonda Simoni Colbert Coyer, MD

## 2023-07-18 ENCOUNTER — Other Ambulatory Visit (HOSPITAL_COMMUNITY): Payer: Self-pay

## 2023-07-18 ENCOUNTER — Other Ambulatory Visit: Payer: Self-pay

## 2023-07-18 NOTE — Assessment & Plan Note (Signed)
Patient doing well with weekly injections of GLP-1 agonist. Will increase to dulaglutide/Trulicity 1.5 mg weekly injections today.

## 2023-07-18 NOTE — Assessment & Plan Note (Signed)
Patient applied to [employer], states she is currently unemployed. Her husband was previously able to help with health care expenses but has also been out of work due to recent lower limb amputation from his diabetes. Patient's daughters are able to provide some financial support. Will continue to follow up on OC application and will send prescriptions under IM program.

## 2023-07-18 NOTE — Assessment & Plan Note (Addendum)
Patient currently taking Metformin 2000 mg daily with breakfast, Jardiance 10 mg daily, and Trulicity 0.75 mg weekly injections. Denies any abdominal pain, dysuria, nausea, vomiting, or diarrhea. States her fasting CBGs have ranged between 130-140, reports highest level as ~170. Denies any low glucose levels <60. Based on fasting glucose range, no need for long acting insulin at this time. Will increase Trulicity dose today.   Patient also endorsing foot heaviness and electric/shock like sensation down her leg. Denied crawling ants sensation, no prior history of anemia in chart. Description seems consistent with neuropathic pain. Patient states it's worse around bedtime. Agreeable to starting gabapentin today.  - START dulaglutide/Trulicity 1.5 mg weekly injections - STOP dulaglutide/Trulicity 0.75 mg weekly injections - START gabapentin 300 mg daily at bedtime

## 2023-07-19 ENCOUNTER — Other Ambulatory Visit: Payer: Self-pay

## 2023-07-19 NOTE — Progress Notes (Signed)
Internal Medicine Clinic Attending  I was physically present during the key portions of the resident provided service and participated in the medical decision making of patient's management care. I reviewed pertinent patient test results.  The assessment, diagnosis, and plan were formulated together and I agree with the documentation in the resident's note.  Mercie Eon, MD

## 2023-08-21 ENCOUNTER — Other Ambulatory Visit: Payer: Self-pay

## 2023-08-21 ENCOUNTER — Ambulatory Visit: Payer: Self-pay | Admitting: Student

## 2023-08-21 VITALS — BP 132/57 | HR 100 | Temp 99.0°F | Ht 64.0 in | Wt 178.6 lb

## 2023-08-21 DIAGNOSIS — E785 Hyperlipidemia, unspecified: Secondary | ICD-10-CM

## 2023-08-21 DIAGNOSIS — E1165 Type 2 diabetes mellitus with hyperglycemia: Secondary | ICD-10-CM | POA: Insufficient documentation

## 2023-08-21 DIAGNOSIS — Z7985 Long-term (current) use of injectable non-insulin antidiabetic drugs: Secondary | ICD-10-CM

## 2023-08-21 DIAGNOSIS — E1169 Type 2 diabetes mellitus with other specified complication: Secondary | ICD-10-CM

## 2023-08-21 DIAGNOSIS — E1142 Type 2 diabetes mellitus with diabetic polyneuropathy: Secondary | ICD-10-CM

## 2023-08-21 LAB — POCT GLYCOSYLATED HEMOGLOBIN (HGB A1C): Hemoglobin A1C: 9 % — AB (ref 4.0–5.6)

## 2023-08-21 LAB — GLUCOSE, CAPILLARY: Glucose-Capillary: 159 mg/dL — ABNORMAL HIGH (ref 70–99)

## 2023-08-21 MED ORDER — TRULICITY 3 MG/0.5ML ~~LOC~~ SOAJ
3.0000 mg | SUBCUTANEOUS | 4 refills | Status: DC
  Filled 2023-08-22: qty 2, 28d supply, fill #0
  Filled 2023-09-22: qty 2, 28d supply, fill #1
  Filled 2023-10-27 (×2): qty 2, 28d supply, fill #2
  Filled 2023-12-07: qty 2, 28d supply, fill #3
  Filled 2024-01-16: qty 2, 28d supply, fill #4

## 2023-08-21 NOTE — Patient Instructions (Signed)
Robyn [name]. Robyn [name] por permitirnos brindarle su atencin hoy. Hoy hablamos de su medicacin para la diabetes y su colesterol en sangre. Sus niveles de Production assistant, radio parecen mejorados hoy.  Estoy aumentando su Trulicity de 1,5 a 3 mg en inyecciones semanales.  Te envi una nueva receta para esto.  Tambin recogimos anlisis de sangre para Firefighter.  Lo llamar si necesito hacer algn cambio en sus medicamentos para el colesterol.     Thank you, [name] for allowing [place] to provide your care today. Today we discussed your diabetes medication and your blood cholesterol. Your blood sugar levels looks improved today.  I am increasing your Trulicity from 1.5 to 3 mg weekly injections.  I sent you a new prescription for this.  We also collected the blood work to check on your cholesterol.  I will call you if I need to make any changes to your cholesterol medications.  I have ordered the following labs for you:  Lab Orders         Glucose, capillary         Lipid Profile         POC Hbg A1C       Tests ordered today:    Referrals ordered today:   Referral Orders  No referral(s) requested today     I have ordered the following medication/changed the following medications:   Stop the following medications: Medications Discontinued During This Encounter  Medication Reason   Dulaglutide (TRULICITY) 1.5 MG/0.5ML SOAJ Dose change     Start the following medications: Meds ordered this encounter  Medications   Dulaglutide (TRULICITY) 3 MG/0.5ML SOAJ    Sig: Inject 3 mg as directed once a week.    Dispense:  2 mL    Refill:  4     Follow up: 2-3 months   Remember:   Should you have any questions or concerns please call the internal medicine clinic at [phone].    Kathleen Lime, M.D [facility]

## 2023-08-21 NOTE — Progress Notes (Signed)
CC: Diabetes medication follow-up  HPI:  Robyn [name] is a 52 y.o. female living with a history stated below and presents today for diabetes medication follow-up and also discuss her other chronic conditions. Please see problem based assessment and plan for additional details.  Past Medical History:  Diagnosis Date   Diabetes mellitus without complication (HCC)    Hypertension     Current Outpatient Medications on File Prior to Visit  Medication Sig Dispense Refill   Blood Glucose Monitoring Suppl (TRUE METRIX METER) w/Device KIT Check sugars daily 1 kit 0   empagliflozin (JARDIANCE) 10 MG TABS tablet Take 1 tablet (10 mg total) by mouth daily before breakfast. 30 tablet 11   gabapentin (NEURONTIN) 300 MG capsule Take 1 capsule (300 mg total) by mouth at bedtime. 90 capsule 2   glucose blood (TRUE METRIX BLOOD GLUCOSE TEST) test strip Use as instructed 100 each 12   irbesartan (AVAPRO) 75 MG tablet Take 1 tablet (75 mg total) by mouth daily for 11 days. 11 tablet 0   metFORMIN (GLUCOPHAGE-XR) 500 MG 24 hr tablet Take 4 tablets (2,000 mg total) by mouth daily with breakfast. 120 tablet 11   naproxen (NAPROSYN) 500 MG tablet Take 1 tablet (500 mg total) by mouth 2 (two) times daily as needed. 30 tablet 0   rosuvastatin (CRESTOR) 20 MG tablet Take 1 tablet (20 mg total) by mouth daily. 30 tablet 11   TRUEplus Lancets 28G MISC Check sugars daily 100 each 1   No current facility-administered medications on file prior to visit.    No family history on file.  Social History   Socioeconomic History   Marital status: Single    Spouse name: Not on file   Number of children: Not on file   Years of education: Not on file   Highest education level: Not on file  Occupational History   Not on file  Tobacco Use   Smoking status: Never   Smokeless tobacco: Never  Substance and Sexual Activity   Alcohol use: No    Alcohol/week: 0.0 standard drinks of alcohol   Drug use: No    Sexual activity: Never  Other Topics Concern   Not on file  Social History Narrative   Not on file   Social Determinants of Health   Financial Resource Strain: Not on file  Food Insecurity: No Food Insecurity (11/02/2022)   Hunger Vital Sign    Worried About Running Out of Food in the Last Year: Never true    Ran Out of Food in the Last Year: Never true  Transportation Needs: Not on file  Physical Activity: Not on file  Stress: Not on file  Social Connections: Moderately Integrated (11/02/2022)   Social Connection and Isolation Panel [NHANES]    Frequency of Communication with Friends and Family: More than three times a week    Frequency of Social Gatherings with Friends and Family: More than three times a week    Attends Religious Services: More than 4 times per year    Active Member of [employer] or Organizations: No    Attends Banker Meetings: Never    Marital Status: Married  Catering manager Violence: Not At Risk (11/02/2022)   Humiliation, Afraid, Rape, and Kick questionnaire    Fear of Current or Ex-Partner: No    Emotionally Abused: No    Physically Abused: No    Sexually Abused: No    Review of Systems: ROS negative except for what is noted  on the assessment and plan.  Vitals:   08/21/23 1554  BP: (!) 132/57  Pulse: 100  Temp: 99 F (37.2 C)  TempSrc: Oral  SpO2: 99%  Weight: 178 lb 9.6 oz (81 kg)  Height: 5\' 4"  (1.626 m)    Physical Exam: Constitutional: Well-appearing Spanish-speaking woman, sitting comfortably in chair with no acute distress Cardiovascular: regular rate and rhythm, no m/r/g Pulmonary/Chest: normal work of breathing on room air, lungs clear to auscultation bilaterally Psych: normal mood and behavior  Assessment & Plan:   No problem-specific Assessment & Plan notes found for this encounter.  Poorly controlled type 2 diabetes mellitus with peripheral neuropathy History of type 2 diabetes with neuropathy.  Her last A1c 3  months ago was 10.0, patient was initiated on Trulicity 1.5 mg weekly injections.  Her neuropathy has been controlled with gabapentin. -Increase Trulicity to 3.0 mg weekly injections /Prescribed 3 mg injection -Continue gabapentin 300 mg 3 times daily  Hyperlipidemia Patient has a history of hyperlipidemia currently being managed with Crestor 20 mg daily.  She has not had a lipid panel done in over a year.  Her LDL at that time was 144. -Check lipid profile today -Continue  Crestor 20 mg daily  -Reassess medications/doses after lipid panel resolves  Patient seen with Dr. Rip Harbour, M.D [facility] Phone: [phone] Date 08/21/2023 Time 4:49 PM

## 2023-08-22 ENCOUNTER — Other Ambulatory Visit: Payer: Self-pay

## 2023-08-22 LAB — LIPID PANEL
Chol/HDL Ratio: 2.4 {ratio} (ref 0.0–4.4)
Cholesterol, Total: 90 mg/dL — ABNORMAL LOW (ref 100–199)
HDL: 38 mg/dL — ABNORMAL LOW (ref >39)
LDL Chol Calc (NIH): 34 mg/dL (ref 0–99)
Triglycerides: 88 mg/dL (ref 0–149)
VLDL Cholesterol Cal: 18 mg/dL (ref 5–40)

## 2023-08-22 NOTE — Progress Notes (Signed)
Internal Medicine Clinic Attending  I saw and evaluated the patient.  I personally confirmed the key portions of the history and exam documented by Dr.  Mickie Bail  and I reviewed pertinent patient test results.  The assessment, diagnosis, and plan were formulated together and I agree with the documentation in the resident's note.

## 2023-08-24 ENCOUNTER — Other Ambulatory Visit: Payer: Self-pay

## 2023-08-24 ENCOUNTER — Other Ambulatory Visit (HOSPITAL_COMMUNITY): Payer: Self-pay

## 2023-08-29 ENCOUNTER — Other Ambulatory Visit (HOSPITAL_COMMUNITY): Payer: Self-pay

## 2023-08-29 ENCOUNTER — Other Ambulatory Visit: Payer: Self-pay

## 2023-09-19 NOTE — Telephone Encounter (Signed)
As of 09/19/23, application still be processed per automated system.

## 2023-09-22 ENCOUNTER — Other Ambulatory Visit: Payer: Self-pay

## 2023-09-25 ENCOUNTER — Other Ambulatory Visit: Payer: Self-pay

## 2023-10-12 NOTE — Progress Notes (Signed)
Pharmacy Medication Assistance Program Note    10/12/2023  Patient ID: Robyn [name], female   DOB: [birth year], 53 y.o.   MRN: [id number]     10/12/2023  [facility]  [employer]  [employer] [facility] Jardiance  Dose of Jardiance 10MG   Type of Radiographer, therapeutic Assistance  Patient Assistance Determination Approved  Approval Start Date 09/22/2023  Approval End Date 09/21/2024     MEDICATION WILL SHIP TO PT'S HOME.

## 2023-10-18 ENCOUNTER — Telehealth: Payer: Self-pay | Admitting: Dietician

## 2023-10-18 NOTE — Telephone Encounter (Signed)
  Called using #[id number] Byrd Hesselbach as interpreter per physician request to offer patient sample CGM. She has to have a compatible phone to use a sample. She states she does not have a smartphone and is not working currently so  she is not interested in CGM at this time.   Able to use her meter, has supplies, states her blood glucose values are:   140-130-148 in am before eating  180-170 in the afternoon after dinner   She had not other diabetes needs at this time

## 2023-10-27 ENCOUNTER — Other Ambulatory Visit: Payer: Self-pay

## 2023-10-30 ENCOUNTER — Other Ambulatory Visit: Payer: Self-pay

## 2023-12-07 ENCOUNTER — Other Ambulatory Visit: Payer: Self-pay

## 2023-12-08 ENCOUNTER — Other Ambulatory Visit: Payer: Self-pay

## 2024-01-16 ENCOUNTER — Other Ambulatory Visit: Payer: Self-pay

## 2024-01-18 ENCOUNTER — Other Ambulatory Visit: Payer: Self-pay

## 2024-02-28 ENCOUNTER — Other Ambulatory Visit: Payer: Self-pay

## 2024-03-08 ENCOUNTER — Other Ambulatory Visit: Payer: Self-pay

## 2024-03-12 ENCOUNTER — Other Ambulatory Visit: Payer: Self-pay | Admitting: Student

## 2024-03-12 ENCOUNTER — Other Ambulatory Visit: Payer: Self-pay

## 2024-03-12 DIAGNOSIS — E1169 Type 2 diabetes mellitus with other specified complication: Secondary | ICD-10-CM

## 2024-03-12 DIAGNOSIS — E1165 Type 2 diabetes mellitus with hyperglycemia: Secondary | ICD-10-CM

## 2024-03-13 ENCOUNTER — Other Ambulatory Visit: Payer: Self-pay

## 2024-03-13 MED ORDER — ROSUVASTATIN CALCIUM 20 MG PO TABS
20.0000 mg | ORAL_TABLET | Freq: Every day | ORAL | 11 refills | Status: AC
  Filled 2024-03-13: qty 30, 30d supply, fill #0

## 2024-03-13 MED ORDER — METFORMIN HCL ER 500 MG PO TB24
2000.0000 mg | ORAL_TABLET | Freq: Every day | ORAL | 11 refills | Status: AC
  Filled 2024-03-13: qty 120, 30d supply, fill #0

## 2024-03-13 NOTE — Telephone Encounter (Signed)
 Patient last seen 08/21/23, I called the patient to schedule a follow up appointment. Unable to reach her, I lvm for her to give us  a call back

## 2024-03-15 ENCOUNTER — Other Ambulatory Visit: Payer: Self-pay

## 2024-03-15 MED ORDER — TRULICITY 3 MG/0.5ML ~~LOC~~ SOAJ
3.0000 mg | SUBCUTANEOUS | 4 refills | Status: AC
  Filled 2024-03-15: qty 2, 28d supply, fill #0

## 2024-05-13 ENCOUNTER — Other Ambulatory Visit: Payer: Self-pay | Admitting: Student

## 2024-05-13 DIAGNOSIS — E669 Obesity, unspecified: Secondary | ICD-10-CM
